# Patient Record
Sex: Female | Born: 1995 | Race: White | Hispanic: No | Smoking: Never smoker
Health system: Southern US, Community
[De-identification: ages and names within clinical notes are randomized; demographics above are authoritative.]

## PROBLEM LIST (undated history)

## (undated) DIAGNOSIS — U071 COVID-19: Secondary | ICD-10-CM

## (undated) HISTORY — PX: OTHER SURGICAL HISTORY: SHX169

---

## 2020-06-29 ENCOUNTER — Ambulatory Visit: Payer: BC Managed Care – PPO | Admitting: Podiatry

## 2020-06-29 ENCOUNTER — Encounter: Payer: Self-pay | Admitting: Podiatry

## 2020-06-29 ENCOUNTER — Other Ambulatory Visit: Payer: Self-pay

## 2020-06-29 DIAGNOSIS — M79675 Pain in left toe(s): Secondary | ICD-10-CM | POA: Diagnosis not present

## 2020-06-29 DIAGNOSIS — L603 Nail dystrophy: Secondary | ICD-10-CM

## 2020-06-29 NOTE — Patient Instructions (Signed)

## 2020-07-04 NOTE — Progress Notes (Signed)
Subjective:   Patient ID: Breanna Erickson, female   DOB: 24 y.o.   MRN: [id number]   HPI 24 year old female presents the office today for concerns of nail dystrophy, thickened, discolored toenails to her left big toe which is been ongoing since she was in middle school and she states the toenail is not growing.  The nail does cause tenderness at times.  Denies any redness or drainage or any swelling to the toenail sites at this time.   Review of Systems  All other systems reviewed and are negative.  History reviewed. No pertinent past medical history.  History reviewed. No pertinent surgical history.  No current outpatient medications on file.  No Known Allergies       Objective:  Physical Exam  General: AAO x3, NAD  Dermatological: Left hallux toenail is hypertrophic and dystrophic with yellow discoloration and does not appear to be growing.  There is no edema, erythema or signs of infection of the tendon site today.  Vascular: Dorsalis Pedis artery and Posterior Tibial artery pedal pulses are 2/4 bilateral with immedate capillary fill time. There is no pain with calf compression, swelling, warmth, erythema.   Neruologic: Grossly intact via light touch bilateral.   Musculoskeletal: No gross boney pedal deformities bilateral. No pain, crepitus, or limitation noted with foot and ankle range of motion bilateral. Muscular strength 5/5 in all groups tested bilateral.  Gait: Unassisted, Nonantalgic.       Assessment:   Left hallux onychodystrophy    Plan:  -Treatment options discussed including all alternatives, risks, and complications -Etiology of symptoms were discussed -After discussion regards to treatment options as the nail is not growing and due to the amount of dystrophy we discussed toenail removal.  She would like to have this done but given her work schedule we hold off on this today.  I will schedule her for after the holidays to have this done due to her schedule.   Monitoring signs or symptoms of infection.  Vivi Barrack DPM

## 2020-07-12 ENCOUNTER — Other Ambulatory Visit: Payer: Self-pay

## 2020-07-12 ENCOUNTER — Ambulatory Visit: Payer: BC Managed Care – PPO | Admitting: Podiatry

## 2020-07-12 DIAGNOSIS — M79675 Pain in left toe(s): Secondary | ICD-10-CM | POA: Diagnosis not present

## 2020-07-12 DIAGNOSIS — L603 Nail dystrophy: Secondary | ICD-10-CM | POA: Diagnosis not present

## 2020-07-12 NOTE — Patient Instructions (Addendum)

## 2020-07-14 ENCOUNTER — Telehealth: Payer: Self-pay | Admitting: Podiatry

## 2020-07-14 NOTE — Telephone Encounter (Signed)
Patient left message on the nurse line about her follow up care. She said she had questions but didn't state what they were.

## 2020-07-14 NOTE — Telephone Encounter (Signed)
I attempted to call the patient back- left voicemail

## 2020-07-15 ENCOUNTER — Telehealth: Payer: Self-pay | Admitting: Podiatry

## 2020-07-15 NOTE — Progress Notes (Signed)
Subjective: 24 year old female presents the office today to have her left big toenail removed given dystrophy to the nail nails no longer growing.  No occasion cause discomfort.  Denies any drainage or pus any swelling or redness. Denies any systemic complaints such as fevers, chills, nausea, vomiting. No acute changes since last appointment, and no other complaints at this time.   Objective: AAO x3, NAD DP/PT pulses palpable bilaterally, CRT less than 3 seconds Left hallux toenails hypertrophic, dystrophic with yellow-brown discoloration and significant dystrophy is present the nail.  There is no edema, erythema and signs of infection today.  No pain with calf compression, swelling, warmth, erythema  Assessment: Left hallux onychodystrophy  Plan: -All treatment options discussed with the patient including all alternatives, risks, complications.  -At this time, recommended total nail removal without chemical matricectomy to the left hallux toenail due to dystrophy. Risks and complications were discussed with the patient for which they understand and  verbally consent to the procedure. Under sterile conditions a total of 3 mL of a mixture of 2% lidocaine plain and 0.5% Marcaine plain was infiltrated in a hallux block fashion. Once anesthetized, the skin was prepped in sterile fashion. A tourniquet was then applied. Next the left hallux nail was excised making sure to remove the entire offending nail border. Once the nail was removed, the area was debrided and the underlying skin was intact. The area was irrigated and hemostasis was obtained.  A dry sterile dressing was applied. After application of the dressing the tourniquet was removed and there is found to be an immediate capillary refill time to the digit. The patient tolerated the procedure well any complications. Post procedure instructions were discussed the patient for which he verbally understood. Follow-up in one week for nail check or sooner  if any problems are to arise. Discussed signs/symptoms of worsening infection and directed to call the office immediately should any occur or go directly to the emergency room. In the meantime, encouraged to call the office with any questions, concerns, changes symptoms. -Nail sent for biopsy/culture to [facility] labs  Breanna Erickson DPM

## 2020-07-15 NOTE — Telephone Encounter (Signed)
I called and went went over the questions.  She has been doing well otherwise.

## 2020-07-15 NOTE — Telephone Encounter (Signed)
Patient wanted to know what she needed to do regarding soaking and when to let the toe breathe after the first week.

## 2020-07-27 ENCOUNTER — Ambulatory Visit: Payer: BC Managed Care – PPO | Admitting: Podiatry

## 2020-07-27 ENCOUNTER — Ambulatory Visit (INDEPENDENT_AMBULATORY_CARE_PROVIDER_SITE_OTHER): Payer: BC Managed Care – PPO | Admitting: Podiatry

## 2020-07-27 ENCOUNTER — Other Ambulatory Visit: Payer: Self-pay

## 2020-07-27 DIAGNOSIS — L600 Ingrowing nail: Secondary | ICD-10-CM

## 2020-07-27 NOTE — Patient Instructions (Signed)

## 2020-08-01 NOTE — Progress Notes (Signed)
Subjective: Selam Pietsch is a 25 y.o.  female returns to office today for follow up evaluation after having left hallux partial nail avulsion performed. Patient has been soaking using epsom salts and applying topical antibiotic covered with bandaid daily.  She states the toe has been doing better and not be any pain, swelling or redness or any drainage and she has no concerns.  Patient denies fevers, chills, nausea, vomiting. Denies any calf pain, chest pain, SOB.   Objective:  General: Well developed, nourished, in no acute distress, alert and oriented x3   Dermatology: Skin is warm, dry and supple bilateral. LEFT hallux nail border appears to be clean, dry, with mild scab. There is no surrounding erythema, edema, drainage/purulence. The remaining nails appear unremarkable at this time. There are no other lesions or other signs of infection present.  Neurovascular status: Intact. No lower extremity swelling; No pain with calf compression bilateral.  Musculoskeletal: No tenderness to palpation of the left hallux nail fold. Muscular strength within normal limits bilateral.   Assesement and Plan: S/p partial nail avulsion, doing well.   -Continue soaking in epsom salts twice a day followed by antibiotic ointment and a band-aid. Can leave uncovered at night. Continue this until completely healed.  -If the area has not healed in 2 weeks, call the office for follow-up appointment, or sooner if any problems arise.  -Monitor for any signs/symptoms of infection. Call the office immediately if any occur or go directly to the emergency room. Call with any questions/concerns. -Awaiting nail culture results  Ovid Curd, DPM

## 2020-09-10 ENCOUNTER — Encounter (HOSPITAL_COMMUNITY): Payer: Self-pay | Admitting: Emergency Medicine

## 2020-09-10 ENCOUNTER — Other Ambulatory Visit: Payer: Self-pay

## 2020-09-10 ENCOUNTER — Ambulatory Visit (HOSPITAL_COMMUNITY)
Admission: EM | Admit: 2020-09-10 | Discharge: 2020-09-10 | Disposition: A | Discharge status: Home / Self Care | Payer: BC Managed Care – PPO | Attending: Student | Admitting: Student

## 2020-09-10 DIAGNOSIS — J040 Acute laryngitis: Secondary | ICD-10-CM | POA: Diagnosis not present

## 2020-09-10 DIAGNOSIS — L089 Local infection of the skin and subcutaneous tissue, unspecified: Secondary | ICD-10-CM | POA: Diagnosis not present

## 2020-09-10 DIAGNOSIS — S01331A Puncture wound without foreign body of right ear, initial encounter: Secondary | ICD-10-CM | POA: Diagnosis not present

## 2020-09-10 DIAGNOSIS — J069 Acute upper respiratory infection, unspecified: Secondary | ICD-10-CM

## 2020-09-10 HISTORY — DX: COVID-19: U07.1

## 2020-09-10 MED ORDER — PREDNISONE 20 MG PO TABS: 20.0000 mg | ORAL_TABLET | Freq: Every day | ORAL | 0 refills | Status: AC

## 2020-09-10 MED ORDER — DOXYCYCLINE HYCLATE 100 MG PO CAPS: 100.0000 mg | ORAL_CAPSULE | Freq: Two times a day (BID) | ORAL | 0 refills | Status: AC

## 2020-09-10 NOTE — Discharge Instructions (Addendum)
-  For your laryngitis, start the steroid-prednisone, 1 pill daily for 5 days.  This can give you energy, so take this in the morning. -For your infected ear piercing, start the antibiotic-doxycycline (Vibramycin), 2 pills daily for 7 days.  Try to avoid taking this within 1 hour of eating.  If you are going to spend long periods of time in the sun, make sure to wear sunscreen while on this medication. -Also, try wearing gold or silver earrings instead of titanium.  Make sure to not wear earrings with medical or a combination of metals.

## 2020-09-10 NOTE — ED Triage Notes (Signed)
Pt presents today with c/o of scratching throat, nasal congestion and cough. Denies pain.

## 2020-09-10 NOTE — ED Provider Notes (Signed)
[facility]    CSN: [id number] Arrival date & time: 09/10/20  0900      History   Chief Complaint Chief Complaint  Patient presents with  . Nasal Congestion  . Sore Throat  . Cough    HPI Breanna Erickson is a 25 y.o. female presenting with nasal congestion, sore throat, cough, infected ear piercing.  History COVID-19 2 months ago. -States that she has been having URI symptoms for 7 days-nasal congestion, sore throat, cough, fevers.  The symptoms have all improved on their own, however she developed laryngitis 2 days ago and this is very annoying. Last fever was 6 days ago. She is wondering if there is a treatment that we can do to improve this.  States she is going to a concert next week, and wants to feel better for that. Denies current fevers/chills, n/v/d, shortness of breath, chest pain, cough, congestion, facial pain, teeth pain, headaches, sore throat, loss of taste/smell, swollen lymph nodes, ear pain.  -Also states that her ear piercing on the right side seems infected.  States she has had issues with this her entire life, and has been told multiple times that she just needs to clean the ear piercings better-but she has never been seen by a medical professional for this.  States that the back of her right ear has been swollen for 3 days, and a small amount of bloody discharge.  Has been been using titanium studs, and is wondering if this could be the problem.    HPI  Past Medical History:  Diagnosis Date  . COVID-19 06/2021    There are no problems to display for this patient.   Past Surgical History:  Procedure Laterality Date  . toe nail removed (left 1st)  06/2021    OB History   No obstetric history on file.      Home Medications    Prior to Admission medications   Medication Sig Start Date End Date Taking? Authorizing Provider  doxycycline (VIBRAMYCIN) 100 MG capsule Take 1 capsule (100 mg total) by mouth 2 (two) times daily for 7 days. 09/10/20  09/17/20 Yes Rhys Martini, PA-C  predniSONE (DELTASONE) 20 MG tablet Take 1 tablet (20 mg total) by mouth daily for 5 days. 09/10/20 09/15/20 Yes Rhys Martini, PA-C    Family History Family History  Problem Relation Age of Onset  . Stroke Mother   . Healthy Father     Social History Social History   Tobacco Use  . Smoking status: Never Smoker  . Smokeless tobacco: Never Used  Substance Use Topics  . Alcohol use: Not Currently    Comment: occ  . Drug use: Not Currently     Allergies   Patient has no known allergies.   Review of Systems Review of Systems  Constitutional: Negative for appetite change, chills and fever.  HENT: Positive for voice change. Negative for congestion, ear pain, rhinorrhea, sinus pressure, sinus pain, sore throat and trouble swallowing.   Eyes: Negative for redness and visual disturbance.  Respiratory: Negative for cough, chest tightness, shortness of breath and wheezing.   Cardiovascular: Negative for chest pain and palpitations.  Gastrointestinal: Negative for abdominal pain, constipation, diarrhea, nausea and vomiting.  Genitourinary: Negative for dysuria, frequency and urgency.  Musculoskeletal: Negative for myalgias.  Skin:       Infected ear piercing.  Neurological: Negative for dizziness, weakness and headaches.  Psychiatric/Behavioral: Negative for confusion.  All other systems reviewed and are negative.  Physical Exam Triage Vital Signs ED Triage Vitals  Enc Vitals Group     BP 09/10/20 0939 119/65     Pulse Rate 09/10/20 0939 87     Resp 09/10/20 0939 16     Temp 09/10/20 0939 98.4 F (36.9 C)     Temp Source 09/10/20 0939 Oral     SpO2 09/10/20 0939 100 %     Weight 09/10/20 0940 110 lb (49.9 kg)     Height 09/10/20 0940 5\' 7"  (1.702 m)     Head Circumference --      Peak Flow --      Pain Score 09/10/20 0940 0     Pain Loc --      Pain Edu? --      Excl. in GC? --    No data found.  Updated Vital Signs BP 119/65  (BP Location: Right Arm)   Pulse 87   Temp 98.4 F (36.9 C) (Oral)   Resp 16   Ht 5\' 7"  (1.702 m)   Wt 110 lb (49.9 kg)   LMP 08/30/2020 (Approximate)   SpO2 100%   BMI 17.23 kg/m   Visual Acuity Right Eye Distance:   Left Eye Distance:   Bilateral Distance:    Right Eye Near:   Left Eye Near:    Bilateral Near:     Physical Exam Vitals reviewed.  Constitutional:      General: She is not in acute distress.    Appearance: Normal appearance. She is not ill-appearing.  HENT:     Head: Normocephalic and atraumatic.     Comments: Smooth erythema posterior pharynx    Right Ear: Hearing, tympanic membrane, ear canal and external ear normal. No swelling or tenderness. There is no impacted cerumen. No mastoid tenderness. Tympanic membrane is not perforated, erythematous, retracted or bulging.     Left Ear: Hearing, tympanic membrane, ear canal and external ear normal. No swelling or tenderness. There is no impacted cerumen. No mastoid tenderness. Tympanic membrane is not perforated, erythematous, retracted or bulging.     Nose:     Right Sinus: No maxillary sinus tenderness or frontal sinus tenderness.     Left Sinus: No maxillary sinus tenderness or frontal sinus tenderness.     Mouth/Throat:     Mouth: Mucous membranes are moist.     Pharynx: Uvula midline. No oropharyngeal exudate or posterior oropharyngeal erythema.     Tonsils: No tonsillar exudate.  Cardiovascular:     Rate and Rhythm: Normal rate and regular rhythm.     Heart sounds: Normal heart sounds.  Pulmonary:     Breath sounds: Normal breath sounds and air entry. No wheezing, rhonchi or rales.  Chest:     Chest wall: No tenderness.  Abdominal:     General: Abdomen is flat. Bowel sounds are normal.     Tenderness: There is no abdominal tenderness. There is no guarding or rebound.  Lymphadenopathy:     Cervical: No cervical adenopathy.  Skin:    Comments: R posterior ear lobe- 53mm x 68mm area of swelling and  tenderness. No discharge able to be expressed. No erythema or warmth.   Neurological:     General: No focal deficit present.     Mental Status: She is alert and oriented to person, place, and time.  Psychiatric:        Attention and Perception: Attention and perception normal.        Mood and Affect: Mood and affect normal.  Behavior: Behavior normal. Behavior is cooperative.        Thought Content: Thought content normal.        Judgment: Judgment normal.      UC Treatments / Results  Labs (all labs ordered are listed, but only abnormal results are displayed) Labs Reviewed - No data to display  EKG   Radiology No results found.  Procedures Procedures (including critical care time)  Medications Ordered in UC Medications - No data to display  Initial Impression / Assessment and Plan / UC Course  I have reviewed the triage vital signs and the nursing notes.  Pertinent labs & imaging results that were available during my care of the patient were reviewed by me and considered in my medical decision making (see chart for details).     This patient is a 25 year old female presenting with laryngitis and infected ear piercing.  For infected ear piercing, doxycycline sent.  Wound care instructions provided.  Return precautions-worsening of infection despite treatment, worsening swelling, any fever/chills, discharge etc.  For laryngitis, prednisone as below.  She is not a diabetic.  Spent over 40 minutes obtaining H&P, performing physical, discussing treatment plan and plan for follow-up with patient. Patient agrees with plan.   This chart was dictated using voice recognition software, Dragon. Despite the best efforts of this provider to proofread and correct errors, errors may still occur which can change documentation meaning.    Final Clinical Impressions(s) / UC Diagnoses   Final diagnoses:  Laryngitis  Viral URI  Pierced ear infection, right, initial encounter      Discharge Instructions     -For your laryngitis, start the steroid-prednisone, 1 pill daily for 5 days.  This can give you energy, so take this in the morning. -For your infected ear piercing, start the antibiotic-doxycycline (Vibramycin), 2 pills daily for 7 days.  Try to avoid taking this within 1 hour of eating.  If you are going to spend long periods of time in the sun, make sure to wear sunscreen while on this medication. -Also, try wearing gold or silver earrings instead of titanium.  Make sure to not wear earrings with medical or a combination of metals.   ED Prescriptions    Medication Sig Dispense Auth. Provider   doxycycline (VIBRAMYCIN) 100 MG capsule Take 1 capsule (100 mg total) by mouth 2 (two) times daily for 7 days. 14 capsule Rhys Martini, PA-C   predniSONE (DELTASONE) 20 MG tablet Take 1 tablet (20 mg total) by mouth daily for 5 days. 5 tablet Rhys Martini, PA-C     PDMP not reviewed this encounter.   Rhys Martini, PA-C 09/10/20 1113

## 2021-07-19 ENCOUNTER — Other Ambulatory Visit: Payer: Self-pay

## 2021-07-19 ENCOUNTER — Ambulatory Visit (INDEPENDENT_AMBULATORY_CARE_PROVIDER_SITE_OTHER): Payer: BC Managed Care – PPO

## 2021-07-19 ENCOUNTER — Ambulatory Visit: Payer: BC Managed Care – PPO | Admitting: Podiatry

## 2021-07-19 DIAGNOSIS — L600 Ingrowing nail: Secondary | ICD-10-CM | POA: Diagnosis not present

## 2021-07-19 DIAGNOSIS — M79672 Pain in left foot: Secondary | ICD-10-CM

## 2021-07-19 DIAGNOSIS — M79675 Pain in left toe(s): Secondary | ICD-10-CM

## 2021-07-19 DIAGNOSIS — B351 Tinea unguium: Secondary | ICD-10-CM

## 2021-07-19 DIAGNOSIS — S9032XA Contusion of left foot, initial encounter: Secondary | ICD-10-CM

## 2021-07-19 MED ORDER — CEPHALEXIN 500 MG PO CAPS: 500.0000 mg | ORAL_CAPSULE | Freq: Three times a day (TID) | ORAL | 0 refills | Status: DC

## 2021-07-19 MED ORDER — EFINACONAZOLE 10 % EX SOLN: 1.0000 [drp] | Freq: Every day | CUTANEOUS | 11 refills | Status: DC

## 2021-07-19 NOTE — Patient Instructions (Signed)
Soak Instructions ° ° ° °THE DAY AFTER THE PROCEDURE ° °Place 1/4 cup of epsom salts in a quart of warm tap water.  Submerge your foot or feet with outer bandage intact for the initial soak; this will allow the bandage to become moist and wet for easy lift off.  Once you remove your bandage, continue to soak in the solution for 20 minutes.  This soak should be done twice a day.  Next, remove your foot or feet from solution, blot dry the affected area and cover.  You may use a band aid large enough to cover the area or use gauze and tape.  Apply other medications to the area as directed by the doctor such as polysporin neosporin. ° °IF YOUR SKIN BECOMES IRRITATED WHILE USING THESE INSTRUCTIONS, IT IS OKAY TO SWITCH TO  WHITE VINEGAR AND WATER. Or you may use antibacterial soap and water to keep the toe clean ° °Monitor for any signs/symptoms of infection. Call the office immediately if any occur or go directly to the emergency room. Call with any questions/concerns. ° ° °Efinaconazole Topical Solution °What is this medication? °EFINACONAZOLE (e FEE na KON a zole) is an antifungal medicine. It is used to treat certain kinds of fungal infections of the toenail. °This medicine may be used for other purposes; ask your health care provider or pharmacist if you have questions. °COMMON BRAND NAME(S): JUBLIA °What should I tell my care team before I take this medication? °They need to know if you have any of these conditions: °an unusual or allergic reaction to efinaconazole, other medicines, foods, dyes or preservatives °pregnant or trying to get pregnant °breast-feeding °How should I use this medication? °This medicine is for external use only. Do not take by mouth. Follow the directions on the label. Wash hands before and after use. Apply this medicine using the provided brush to cover the entire toenail. Do not use your medicine more often than directed. Finish the full course prescribed by your doctor or health care  professional even if you think your condition is better. Do not stop using except on the advice of your doctor or health care professional. °Talk to your pediatrician regarding the use of this medicine in children. While this drug may be prescribed for children as young as 6 years for selected conditions, precautions do apply. °Overdosage: If you think you have taken too much of this medicine contact a poison control center or emergency room at once. °NOTE: This medicine is only for you. Do not share this medicine with others. °What if I miss a dose? °If you miss a dose, use it as soon as you can. If it is almost time for your next dose, use only that dose. Do not use double or extra doses. °What may interact with this medication? °Interactions have not been studied. Do not use any other nail products (i.e., nail polish, pedicures) during treatment with this medicine. °This list may not describe all possible interactions. Give your health care provider a list of all the medicines, herbs, non-prescription drugs, or dietary supplements you use. Also tell them if you smoke, drink alcohol, or use illegal drugs. Some items may interact with your medicine. °What should I watch for while using this medication? °Do not get this medicine in your eyes. If you do, rinse out with plenty of cool tap water. °Tell your doctor or health care professional if your symptoms do not start to get better or if they get worse. °Wait for   at least 10 minutes after bathing before applying this medication. After bathing, make sure that your feet are very dry. Fungal infections like moist conditions. Do not walk around barefoot. To help prevent reinfection, wear freshly washed cotton, not synthetic clothing. Tell your doctor or health care professional if you develop sores or blisters that do not heal properly. If your nail infection returns after you stop using this medicine, contact your doctor or health care professional. °What side effects  may I notice from receiving this medication? °Side effects that you should report to your doctor or health care professional as soon as possible: °allergic reactions like skin rash, itching or hives, swelling of the face, lips, or tongue °ingrown toenail °Side effects that usually do not require medical attention (report to your doctor or health care professional if they continue or are bothersome): °mild skin irritation, burning, or itching °This list may not describe all possible side effects. Call your doctor for medical advice about side effects. You may report side effects to FDA at 1-800-FDA-1088. °Where should I keep my medication? °Keep out of the reach of children. °Store at room temperature between 20 and 25 degrees C (68 and 77 degrees F). Keep this medicine in the original container. Throw away any unused medicine after the expiration date. °This medicine is flammable. Avoid exposure to heat, fire, flame, and smoking. °NOTE: This sheet is a summary. It may not cover all possible information. If you have questions about this medicine, talk to your doctor, pharmacist, or health care provider. °© 2022 [employer] (2018-11-13 00:00:00) ° °

## 2021-07-22 NOTE — Progress Notes (Signed)
Subjective: 26 year old female presents the office today for concerns of discomfort to the left.  The nail.  Patient complains about starting to become uncomfortable and she is noticing redness and swelling with intermittent discomfort with pain levels 2/10.  States the nails becoming ingrown.  She has tried soaking Epson salts.  Denies any fevers or chills currently.  No other concerns.  Objective: AAO x3, NAD DP/PT pulses palpable bilaterally, CRT less than 3 seconds On the left hallux toenail the nail is dystrophic and discolored.  There is localized edema erythema but not able to notice any drainage or pus at this time.  Nail adhered to the underlying nailbed.  No open lesions. No pain with calf compression, swelling, warmth, erythema  Assessment: 26 year old female with ingrown toenail, onychomycosis  Plan: -All treatment options discussed with the patient including all alternatives, risks, complications.  -X-rays obtained reviewed.  No significant bone spur present which is contributing to the ingrown toenail.  No osteomyelitis or fracture noted. -We discussed nail removal but she is on hold off on that for now pending this when she has some time off.  For now discussed continuing Epson salt soaks and prescribed cephalexin.  Monitor for any signs or symptoms of recurrence of infection and let me know if there is any changes. -Prescribe Jublia for the likely nail fungus -Patient encouraged to call the office with any questions, concerns, change in symptoms.   Return for nail removal .  Trula Slade DPM

## 2021-08-09 ENCOUNTER — Other Ambulatory Visit: Payer: Self-pay

## 2021-08-09 ENCOUNTER — Ambulatory Visit: Payer: BC Managed Care – PPO | Admitting: Podiatry

## 2021-08-09 DIAGNOSIS — L608 Other nail disorders: Secondary | ICD-10-CM | POA: Diagnosis not present

## 2021-08-09 DIAGNOSIS — B351 Tinea unguium: Secondary | ICD-10-CM | POA: Diagnosis not present

## 2021-08-09 DIAGNOSIS — L600 Ingrowing nail: Secondary | ICD-10-CM | POA: Diagnosis not present

## 2021-08-09 NOTE — Patient Instructions (Signed)

## 2021-08-15 NOTE — Progress Notes (Signed)
Subjective: 26 year old female presents the office today for Evaluation of left ingrown toenail, onychodystrophy.  She presents today for nail removal.  She says the nail does still get tender with pressure.  She gets some occasional swelling redness of the toenail on the borders.  No drainage that she reports.  She has no other concerns today.  Objective: AAO x3, NAD DP/PT pulses palpable bilaterally, CRT less than 3 seconds Overall exam appears to be unchanged.  Left hallux toenails hypertrophic, dystrophic at the distal portion of nail.  Nails from adhered to the underlying nailbed.  There is tenderness palpation upon direct palpation of the nail.  No edema, erythema or signs of infection.  No pain with calf compression, swelling, warmth, erythema  Assessment: Symptomatic ingrown toenail, onychodystrophy  Plan: -All treatment options discussed with the patient including all alternatives, risks, complications.  -Discussed nail removal given the discomfort she wants to proceed with this. -At this time, discussed total nail removal without chemical matricectomy to the left hallux due to pain. Risks and complications were discussed with the patient for which they understand and  verbally consent to the procedure. Under sterile conditions a total of 3 mL of a mixture of 2% lidocaine plain and 0.5% Marcaine plain was infiltrated in a hallux block fashion. Once anesthetized, the skin was prepped in sterile fashion. A tourniquet was then applied. Next the left hallux nail was removed in total making sure to move all nail borders.  The distal portion of nail is significantly hypertrophic and dystrophic.  Once the nail was removed, the area was debrided and the underlying skin was intact. The area was irrigated and hemostasis was obtained.  A dry sterile dressing was applied. After application of the dressing the tourniquet was removed and there is found to be an immediate capillary refill time to the digit.  The patient tolerated the procedure well any complications. Post procedure instructions were discussed the patient for which he verbally understood. Follow-up in one week for nail check or sooner if any problems are to arise. Discussed signs/symptoms of worsening infection and directed to call the office immediately should any occur or go directly to the emergency room. In the meantime, encouraged to call the office with any questions, concerns, changes symptoms. -Patient encouraged to call the office with any questions, concerns, change in symptoms.   Vivi Barrack DPM

## 2021-08-23 ENCOUNTER — Other Ambulatory Visit: Payer: Self-pay

## 2021-08-23 ENCOUNTER — Ambulatory Visit: Payer: BC Managed Care – PPO | Admitting: Podiatry

## 2021-08-23 DIAGNOSIS — L600 Ingrowing nail: Secondary | ICD-10-CM

## 2021-08-23 DIAGNOSIS — B351 Tinea unguium: Secondary | ICD-10-CM

## 2021-08-23 NOTE — Patient Instructions (Signed)

## 2021-08-25 NOTE — Progress Notes (Signed)
Subjective: 26 year old female presents the office today for evaluation after undergoing total nail avulsion to her left big toe.  She states that she is doing well and she has not seen any drainage or pus and no significant pain.  She still soaking in Epsom salts.  Denies any fevers or chills.  No other concerns.  Objective: AAO x3, NAD DP/PT pulses palpable bilaterally, CRT less than 3 seconds Status post left total nail avulsion.  Smaller granulation tissue is present with scab is forming.  There is minimal edema but there is no ascending cellulitis.  No drainage or pus or any signs of infection there is no tenderness palpation. No pain with calf compression, swelling, warmth, erythema  Assessment: Status post left total nail avulsion, healing well  Plan: -All treatment options discussed with the patient including all alternatives, risks, complications.  -At this point recommend continue soaking with Epsom salts, the antibiotic ointment and a bandage in a day.  Discussed washing the toe with soap and water to help overtime get the scab off.  Monitor for any signs or symptoms of infection there is any issues to let me know.  We will need to monitor the nail as it grows back again.  Should it cause any issues going into let me know. -Patient encouraged to call the office with any questions, concerns, change in symptoms.   Trula Slade DPM

## 2021-09-15 ENCOUNTER — Encounter (HOSPITAL_COMMUNITY): Payer: Self-pay

## 2021-09-15 ENCOUNTER — Ambulatory Visit (HOSPITAL_COMMUNITY)
Admission: EM | Admit: 2021-09-15 | Discharge: 2021-09-15 | Disposition: A | Discharge status: Home / Self Care | Payer: BC Managed Care – PPO | Attending: Family Medicine | Admitting: Family Medicine

## 2021-09-15 ENCOUNTER — Other Ambulatory Visit: Payer: Self-pay

## 2021-09-15 DIAGNOSIS — R051 Acute cough: Secondary | ICD-10-CM

## 2021-09-15 DIAGNOSIS — J029 Acute pharyngitis, unspecified: Secondary | ICD-10-CM | POA: Diagnosis not present

## 2021-09-15 MED ORDER — PROMETHAZINE-DM 6.25-15 MG/5ML PO SYRP: 5.0000 mL | ORAL_SOLUTION | Freq: Four times a day (QID) | ORAL | 0 refills | Status: AC | PRN

## 2021-09-15 NOTE — ED Triage Notes (Signed)
Pt presents with c/o body aches, fever and chills x 2 days. States she has a sore throat x 1 week.  ?

## 2021-09-19 ENCOUNTER — Other Ambulatory Visit: Payer: Self-pay

## 2021-09-19 ENCOUNTER — Emergency Department (HOSPITAL_COMMUNITY): Payer: BC Managed Care – PPO

## 2021-09-19 ENCOUNTER — Inpatient Hospital Stay (HOSPITAL_COMMUNITY)
Admission: EM | Admit: 2021-09-19 | Discharge: 2021-09-22 | DRG: 872 | Disposition: A | Discharge status: Home / Self Care | Payer: BC Managed Care – PPO | Attending: Internal Medicine | Admitting: Internal Medicine

## 2021-09-19 DIAGNOSIS — Z8616 Personal history of COVID-19: Secondary | ICD-10-CM

## 2021-09-19 DIAGNOSIS — E871 Hypo-osmolality and hyponatremia: Secondary | ICD-10-CM | POA: Diagnosis present

## 2021-09-19 DIAGNOSIS — N10 Acute tubulo-interstitial nephritis: Secondary | ICD-10-CM | POA: Diagnosis present

## 2021-09-19 DIAGNOSIS — R0781 Pleurodynia: Secondary | ICD-10-CM | POA: Diagnosis present

## 2021-09-19 DIAGNOSIS — Z823 Family history of stroke: Secondary | ICD-10-CM

## 2021-09-19 DIAGNOSIS — I959 Hypotension, unspecified: Secondary | ICD-10-CM | POA: Diagnosis present

## 2021-09-19 DIAGNOSIS — R000 Tachycardia, unspecified: Secondary | ICD-10-CM | POA: Diagnosis present

## 2021-09-19 DIAGNOSIS — R079 Chest pain, unspecified: Secondary | ICD-10-CM

## 2021-09-19 DIAGNOSIS — B9620 Unspecified Escherichia coli [E. coli] as the cause of diseases classified elsewhere: Secondary | ICD-10-CM | POA: Diagnosis present

## 2021-09-19 DIAGNOSIS — Z79899 Other long term (current) drug therapy: Secondary | ICD-10-CM

## 2021-09-19 DIAGNOSIS — A419 Sepsis, unspecified organism: Principal | ICD-10-CM | POA: Diagnosis present

## 2021-09-19 DIAGNOSIS — Z20822 Contact with and (suspected) exposure to covid-19: Secondary | ICD-10-CM | POA: Diagnosis present

## 2021-09-19 DIAGNOSIS — R6520 Severe sepsis without septic shock: Secondary | ICD-10-CM | POA: Diagnosis present

## 2021-09-19 DIAGNOSIS — N12 Tubulo-interstitial nephritis, not specified as acute or chronic: Secondary | ICD-10-CM

## 2021-09-19 DIAGNOSIS — D649 Anemia, unspecified: Secondary | ICD-10-CM | POA: Diagnosis present

## 2021-09-19 DIAGNOSIS — Z793 Long term (current) use of hormonal contraceptives: Secondary | ICD-10-CM

## 2021-09-19 DIAGNOSIS — E876 Hypokalemia: Secondary | ICD-10-CM | POA: Diagnosis present

## 2021-09-19 LAB — CBC WITH DIFFERENTIAL/PLATELET
Abs Immature Granulocytes: 0.36 10*3/uL — ABNORMAL HIGH (ref 0.00–0.07)
Basophils Absolute: 0.1 10*3/uL (ref 0.0–0.1)
Basophils Relative: 0 %
Eosinophils Absolute: 0.1 10*3/uL (ref 0.0–0.5)
Eosinophils Relative: 0 %
HCT: 32.2 % — ABNORMAL LOW (ref 36.0–46.0)
Hemoglobin: 11.1 g/dL — ABNORMAL LOW (ref 12.0–15.0)
Immature Granulocytes: 1 %
Lymphocytes Relative: 4 %
Lymphs Abs: 1.2 10*3/uL (ref 0.7–4.0)
MCH: 31.3 pg (ref 26.0–34.0)
MCHC: 34.5 g/dL (ref 30.0–36.0)
MCV: 90.7 fL (ref 80.0–100.0)
Monocytes Absolute: 1.3 10*3/uL — ABNORMAL HIGH (ref 0.1–1.0)
Monocytes Relative: 4 %
Neutro Abs: 27.6 10*3/uL — ABNORMAL HIGH (ref 1.7–7.7)
Neutrophils Relative %: 91 %
Platelets: 305 10*3/uL (ref 150–400)
RBC: 3.55 MIL/uL — ABNORMAL LOW (ref 3.87–5.11)
RDW: 12.9 % (ref 11.5–15.5)
WBC: 30.6 10*3/uL — ABNORMAL HIGH (ref 4.0–10.5)
nRBC: 0 % (ref 0.0–0.2)

## 2021-09-19 LAB — RESP PANEL BY RT-PCR (FLU A&B, COVID) ARPGX2
Influenza A by PCR: NEGATIVE
Influenza B by PCR: NEGATIVE
SARS Coronavirus 2 by RT PCR: NEGATIVE

## 2021-09-19 LAB — URINALYSIS, ROUTINE W REFLEX MICROSCOPIC
Bilirubin Urine: NEGATIVE
Glucose, UA: NEGATIVE mg/dL
Ketones, ur: 5 mg/dL — AB
Nitrite: POSITIVE — AB
Protein, ur: 100 mg/dL — AB
Specific Gravity, Urine: 1.013 (ref 1.005–1.030)
WBC, UA: >50 WBC/hpf — ABNORMAL HIGH (ref 0–5)
pH: 5 (ref 5.0–8.0)

## 2021-09-19 LAB — COMPREHENSIVE METABOLIC PANEL
ALT: 9 U/L (ref 0–44)
AST: 15 U/L (ref 15–41)
Albumin: 3.8 g/dL (ref 3.5–5.0)
Alkaline Phosphatase: 40 U/L (ref 38–126)
Anion gap: 9 (ref 5–15)
BUN: 8 mg/dL (ref 6–20)
CO2: 22 mmol/L (ref 22–32)
Calcium: 8.8 mg/dL — ABNORMAL LOW (ref 8.9–10.3)
Chloride: 102 mmol/L (ref 98–111)
Creatinine, Ser: 0.91 mg/dL (ref 0.44–1.00)
GFR, Estimated: >60 mL/min (ref >60)
Glucose, Bld: 110 mg/dL — ABNORMAL HIGH (ref 70–99)
Potassium: 3.2 mmol/L — ABNORMAL LOW (ref 3.5–5.1)
Sodium: 133 mmol/L — ABNORMAL LOW (ref 135–145)
Total Bilirubin: 0.6 mg/dL (ref 0.3–1.2)
Total Protein: 7.7 g/dL (ref 6.5–8.1)

## 2021-09-19 LAB — I-STAT BETA HCG BLOOD, ED (MC, WL, AP ONLY): I-stat hCG, quantitative: <5 m[IU]/mL (ref <5)

## 2021-09-19 LAB — LIPASE, BLOOD: Lipase: 26 U/L (ref 11–51)

## 2021-09-19 MED ORDER — CEFTRIAXONE SODIUM 1 G IJ SOLR
1.0000 g | Freq: Once | INTRAMUSCULAR | Status: AC
  Administered 2021-09-19: 1 g via INTRAVENOUS
  Filled 2021-09-19: qty 10

## 2021-09-19 MED ORDER — IOHEXOL 300 MG/ML  SOLN
80.0000 mL | Freq: Once | INTRAMUSCULAR | Status: AC | PRN
  Administered 2021-09-19: 80 mL via INTRAVENOUS

## 2021-09-19 MED ORDER — SODIUM CHLORIDE 0.9 % IV BOLUS
1000.0000 mL | Freq: Once | INTRAVENOUS | Status: AC
  Administered 2021-09-19: 1000 mL via INTRAVENOUS

## 2021-09-19 MED ORDER — SODIUM CHLORIDE 0.9 % IV BOLUS
500.0000 mL | Freq: Once | INTRAVENOUS | Status: AC
  Administered 2021-09-20: 500 mL via INTRAVENOUS

## 2021-09-19 MED ORDER — LACTATED RINGERS IV SOLN
INTRAVENOUS | Status: AC
  Administered 2021-09-20: 1000 mL via INTRAVENOUS

## 2021-09-19 MED ORDER — ACETAMINOPHEN 500 MG PO TABS
1000.0000 mg | ORAL_TABLET | Freq: Once | ORAL | Status: AC
  Administered 2021-09-19: 1000 mg via ORAL
  Filled 2021-09-19: qty 2

## 2021-09-19 NOTE — ED Triage Notes (Signed)
Patient c/o flu like symptoms started this morning. Pt denies N/V/D. Pt a/xo4. ?

## 2021-09-19 NOTE — ED Provider Notes (Signed)
[facility]-EMERGENCY DEPT Provider Note   CSN: [id number] Arrival date & time: 09/19/21  1957     History  Chief Complaint  Patient presents with   flu like symptoms    Breanna Erickson is a 26 y.o. female.  The history is provided by the patient. No language interpreter was used.   26 year old female presenting with flulike symptoms.  Patient report about 2 weeks ago she developed cold symptoms including congestion and cough.  States she coughed quite a bit that she did develop some abdominal discomfort for the past few days.  Pain is to her upper abdomen, sharp and achy.  She now developed fever, headache, and body aches.  States the cough and congestion has mostly subsided.  She denies any dysuria.  She denies nausea vomiting diarrhea or rash.  Sore throat has resolved  Home Medications Prior to Admission medications   Medication Sig Start Date End Date Taking? Authorizing Provider  cephALEXin (KEFLEX) 500 MG capsule Take 1 capsule (500 mg total) by mouth 3 (three) times daily. 07/19/21   Vivi Barrack, DPM  Efinaconazole 10 % SOLN Apply 1 drop topically daily. 07/19/21   Vivi Barrack, DPM  promethazine-dextromethorphan (PROMETHAZINE-DM) 6.25-15 MG/5ML syrup Take 5 mLs by mouth 4 (four) times daily as needed for cough. 09/15/21   Mardella Layman, MD      Allergies    Patient has no known allergies.    Review of Systems   Review of Systems  All other systems reviewed and are negative.  Physical Exam Updated Vital Signs BP 120/71 (BP Location: Left Arm)    Pulse (!) 154    Temp (!) 102.9 F (39.4 C) (Oral)    Resp 18    Ht 5\' 7"  (1.702 m)    Wt 49 kg    SpO2 97%    BMI 16.92 kg/m  Physical Exam Vitals and nursing note reviewed.  Constitutional:      General: She is not in acute distress.    Appearance: She is well-developed.     Comments: Nontoxic in appearance  HENT:     Head: Atraumatic.     Nose: Nose normal.     Mouth/Throat:      Mouth: Mucous membranes are moist.     Comments: Uvula midline no tonsillar enlargement or exudate Eyes:     Conjunctiva/sclera: Conjunctivae normal.  Neck:     Comments: No nuchal rigidity Cardiovascular:     Rate and Rhythm: Tachycardia present.     Pulses: Normal pulses.     Heart sounds: Normal heart sounds.  Pulmonary:     Effort: Pulmonary effort is normal.     Breath sounds: No wheezing, rhonchi or rales.  Abdominal:     Palpations: Abdomen is soft.     Tenderness: There is abdominal tenderness (Epigastric and periumbilical tenderness no guarding or rebound tenderness.  Negative Murphy sign, no pain at McBurney's point).  Musculoskeletal:     Cervical back: Normal range of motion and neck supple. No rigidity.  Skin:    Findings: No rash.  Neurological:     Mental Status: She is alert.  Psychiatric:        Mood and Affect: Mood normal.    ED Results / Procedures / Treatments   Labs (all labs ordered are listed, but only abnormal results are displayed) Labs Reviewed  CBC WITH DIFFERENTIAL/PLATELET - Abnormal; Notable for the following components:      Result Value  WBC 30.6 (*)    RBC 3.55 (*)    Hemoglobin 11.1 (*)    HCT 32.2 (*)    Neutro Abs 27.6 (*)    Monocytes Absolute 1.3 (*)    Abs Immature Granulocytes 0.36 (*)    All other components within normal limits  COMPREHENSIVE METABOLIC PANEL - Abnormal; Notable for the following components:   Sodium 133 (*)    Potassium 3.2 (*)    Glucose, Bld 110 (*)    Calcium 8.8 (*)    All other components within normal limits  URINALYSIS, ROUTINE W REFLEX MICROSCOPIC - Abnormal; Notable for the following components:   APPearance CLOUDY (*)    Hgb urine dipstick MODERATE (*)    Ketones, ur 5 (*)    Protein, ur 100 (*)    Nitrite POSITIVE (*)    Leukocytes,Ua LARGE (*)    WBC, UA >50 (*)    Bacteria, UA MANY (*)    All other components within normal limits  RESP PANEL BY RT-PCR (FLU A&B, COVID) ARPGX2  CULTURE,  BLOOD (ROUTINE X 2)  CULTURE, BLOOD (ROUTINE X 2)  URINE CULTURE  LIPASE, BLOOD  LACTIC ACID, PLASMA  LACTIC ACID, PLASMA  PROTIME-INR  APTT  I-STAT BETA HCG BLOOD, ED ([facility], [facility], [facility] ONLY)    EKG None  Date: 09/19/2021  Rate: 154  Rhythm: sinus tachycardia  QRS Axis: normal  Intervals: normal  ST/T Wave abnormalities: normal  Conduction Disutrbances: none  Narrative Interpretation:   Old EKG Reviewed: No significant changes noted    Radiology CT ABDOMEN PELVIS W CONTRAST  Result Date: 09/19/2021 CLINICAL DATA:  Abdominal pain EXAM: CT ABDOMEN AND PELVIS WITH CONTRAST TECHNIQUE: Multidetector CT imaging of the abdomen and pelvis was performed using the standard protocol following bolus administration of intravenous contrast. RADIATION DOSE REDUCTION: This exam was performed according to the departmental dose-optimization program which includes automated exposure control, adjustment of the mA and/or kV according to patient size and/or use of iterative reconstruction technique. CONTRAST:  104mL OMNIPAQUE IOHEXOL 300 MG/ML  SOLN COMPARISON:  None. FINDINGS: Lower chest: No acute abnormality. Hepatobiliary: No focal liver abnormality is seen. No gallstones, gallbladder wall thickening, or biliary dilatation. Pancreas: Unremarkable. No pancreatic ductal dilatation or surrounding inflammatory changes. Spleen: Normal in size without focal abnormality. Adrenals/Urinary Tract: Adrenal glands are within normal limits. Kidneys show normal enhancement pattern with the exception of the lower pole of the right kidney which demonstrates patchy nephrogram suspicious for pyelonephritis. Small cyst is noted within the left kidney. No obstructive changes are seen. Mild enhancement of the ureteral wall on the right is noted. Bladder is decompressed. Stomach/Bowel: Fecal material is noted throughout the colon. No obstructive inflammatory changes are seen. The appendix is well visualized on the coronal images and  within normal limits. Small bowel and stomach are unremarkable. Vascular/Lymphatic: No significant vascular findings are present. No enlarged abdominal or pelvic lymph nodes. Reproductive: Uterus and bilateral adnexa are unremarkable. Other: No abdominal wall hernia or abnormality. No abdominopelvic ascites. Musculoskeletal: No acute or significant osseous findings. IMPRESSION: Decreased enhancement in the lower pole of the right kidney consistent with pyelonephritis. Some associated ureteral wall enhancement is noted consistent with underlying UTI. No other focal abnormality is noted. Electronically Signed   By: Alcide Clever M.D.   On: 09/19/2021 21:52   DG Chest Portable 1 View  Result Date: 09/19/2021 CLINICAL DATA:  Cough EXAM: PORTABLE CHEST 1 VIEW COMPARISON:  None. FINDINGS: Heart size and mediastinal contours are within  normal limits. No suspicious pulmonary opacities identified. No pleural effusion or pneumothorax visualized. No acute osseous abnormality appreciated. IMPRESSION: No acute intrathoracic process identified. Electronically Signed   By: Jannifer Hick M.D.   On: 09/19/2021 20:53    Procedures .Critical Care Performed by: Fayrene Helper, PA-C Authorized by: Fayrene Helper, PA-C   Critical care provider statement:    Critical care time (minutes):  40   Critical care was time spent personally by me on the following activities:  Development of treatment plan with patient or surrogate, discussions with consultants, evaluation of patient's response to treatment, examination of patient, ordering and review of laboratory studies, ordering and review of radiographic studies, ordering and performing treatments and interventions, pulse oximetry, re-evaluation of patient's condition and review of old charts    Medications Ordered in ED Medications  lactated ringers infusion (has no administration in time range)  sodium chloride 0.9 % bolus 500 mL (has no administration in time range)   acetaminophen (TYLENOL) tablet 1,000 mg (1,000 mg Oral Given 09/19/21 2031)  sodium chloride 0.9 % bolus 1,000 mL (0 mLs Intravenous Stopped 09/19/21 2134)  iohexol (OMNIPAQUE) 300 MG/ML solution 80 mL (80 mLs Intravenous Contrast Given 09/19/21 2134)  cefTRIAXone (ROCEPHIN) 1 g in sodium chloride 0.9 % 100 mL IVPB (0 g Intravenous Stopped 09/19/21 2337)  sodium chloride 0.9 % bolus 1,000 mL (0 mLs Intravenous Stopped 09/19/21 2337)    ED Course/ Medical Decision Making/ A&P                           Medical Decision Making Amount and/or Complexity of Data Reviewed Labs: ordered. Radiology: ordered.  Risk Prescription drug management.   BP 120/71 (BP Location: Left Arm)    Pulse (!) 154    Temp (!) 102.9 F (39.4 C) (Oral)    Resp 18    Ht 5\' 7"  (1.702 m)    Wt 49 kg    SpO2 97%    BMI 16.92 kg/m   8:37 PM Patient with cold symptoms for the past several weeks that has mostly resolved however she returns today complaining of having high fever headache body aches and having abdominal pain.  She has mild epigastric and periumbilical tenderness on initial exam.  Lungs are clear to auscultation.  Patient endorsed headache however she does not have any nuchal rigidity to suggest meningitis.  She does have a elevated temperature of 102.9, Tylenol given.  She is tachycardic with a heart rate of 154 likely sinus tachycardia secondary to her fever.  We will give IV fluid, check labs, and obtain viral respiratory panel.  Chest x-ray ordered.  Work-up initiated.  9:22 PM Labs obtained and independently reviewed interpreted by me.  Her pregnancy test is negative, she has a markedly elevated white count of 30.6.  She has mild hypokalemia with a potassium of 3.2.  She has normal lipase, viral respiratory panel obtained and negative for COVID and flu.  Initial chest x-ray without any signs of pneumonia or acute pulmonary finding.  Given this presentation and her abdominal pain, will obtain abdominal pelvis CT  scan for further assessment.  10:10 PM Urinalysis obtained which shows a cloudy appearance with nitrite positive, large amount of leukocyte esterase as well as many bacteria consistent with a urinary tract infection.  CT scan of the abdomen pelvis obtained demonstrate finding consistent with pyelonephritis with underlying UTI.  No evidence of appendicitis or colitis.  At this time patient was  giving Rocephin IV to treat for UTI.  On reassessment, heart rate did improve with IV fluid as well as fever has reduced.  We will continue with current management.  11:46 PM Unfortunately after receiving 2 L of IV fluid, blood pressure has decreased with current blood pressure of 98/60 and heart rate of 107.  In the setting of sepsis without improvement of vital sign, I discussed this with patient and will consult for admission.  Patient voiced understanding and agrees with plan.  Please note I did discussed option of perform pelvic exam to rule out STI but patient states she is currently not sexually active and is not a concern of STI therefore STI screening was not obtained  12:27 AM Pt sign out to oncoming team who will consult medicine for admission.   This patient presents to the ED for concern of abd pain, this involves an extensive number of treatment options, and is a complaint that carries with it a high risk of complications and morbidity.  The differential diagnosis includes appendicitis, colitis, diverticulitis, pancreatitis, cholecystitis, pneumonia, UTI, pyelonephritis, kidney stone, PUD, gastritis, viral GI  Co morbidities that complicate the patient evaluation none Additional history obtained:  Additional history obtained from no one External records from outside source obtained and reviewed including no prior notes  Lab Tests:  I Ordered, and personally interpreted labs.  The pertinent results include:  WBC 30, UA with UTI  Imaging Studies ordered:  I ordered imaging studies  including abd/pelvis Ct I independently visualized and interpreted imaging which showed pyelonephritis I agree with the radiologist interpretation  Cardiac Monitoring:  The patient was maintained on a cardiac monitor.  I personally viewed and interpreted the cardiac monitored which showed an underlying rhythm of: sinus tachycardia  Medicines ordered and prescription drug management:  I ordered medication including rocephin  for UTI Reevaluation of the patient after these medicines showed that the patient improved I have reviewed the patients home medicines and have made adjustments as needed  Test Considered: chest CTA to r/o PE however tachycardia is likely 2/2 sepsis and pt does not have SOB  Critical Interventions: IVF  IV abx  antipyretic  Problem List / ED Course: pyelonephritis  Reevaluation:  After the interventions noted above, I reevaluated the patient and found that they have :improved  Social Determinants of Health: lack of PCP  Dispostion:  After consideration of the diagnostic results and the patients response to treatment, I feel that the patent would benefit from outpt f/u.         Final Clinical Impression(s) / ED Diagnoses Final diagnoses:  Pyelonephritis  Sepsis, due to unspecified organism, unspecified whether acute organ dysfunction present [facility](HCC)    Rx / DC Orders ED Discharge Orders     None         Fayrene Helperran, Railyn House, PA-C 09/20/21 Marlowe Aschoff0028    Dixon, Ryan, MD 09/23/21 1144

## 2021-09-19 NOTE — ED Notes (Signed)
Pt NAD in bed, a/ox4. Pt states she began having RLQ ABD pain 4 days ago, then right flank pain last night and fever today. Pt denies cough, sick contacts, n/v/d, dysuria or GU symptoms.  ?

## 2021-09-20 ENCOUNTER — Other Ambulatory Visit: Payer: Self-pay

## 2021-09-20 ENCOUNTER — Encounter (HOSPITAL_COMMUNITY): Payer: Self-pay | Admitting: Internal Medicine

## 2021-09-20 DIAGNOSIS — R079 Chest pain, unspecified: Secondary | ICD-10-CM | POA: Diagnosis not present

## 2021-09-20 DIAGNOSIS — I959 Hypotension, unspecified: Secondary | ICD-10-CM | POA: Diagnosis present

## 2021-09-20 DIAGNOSIS — D649 Anemia, unspecified: Secondary | ICD-10-CM

## 2021-09-20 DIAGNOSIS — Z79899 Other long term (current) drug therapy: Secondary | ICD-10-CM | POA: Diagnosis not present

## 2021-09-20 DIAGNOSIS — Z823 Family history of stroke: Secondary | ICD-10-CM | POA: Diagnosis not present

## 2021-09-20 DIAGNOSIS — A419 Sepsis, unspecified organism: Secondary | ICD-10-CM | POA: Diagnosis present

## 2021-09-20 DIAGNOSIS — E871 Hypo-osmolality and hyponatremia: Secondary | ICD-10-CM

## 2021-09-20 DIAGNOSIS — E876 Hypokalemia: Secondary | ICD-10-CM | POA: Diagnosis present

## 2021-09-20 DIAGNOSIS — N10 Acute tubulo-interstitial nephritis: Secondary | ICD-10-CM | POA: Diagnosis present

## 2021-09-20 DIAGNOSIS — Z20822 Contact with and (suspected) exposure to covid-19: Secondary | ICD-10-CM | POA: Diagnosis present

## 2021-09-20 DIAGNOSIS — B9620 Unspecified Escherichia coli [E. coli] as the cause of diseases classified elsewhere: Secondary | ICD-10-CM | POA: Diagnosis present

## 2021-09-20 DIAGNOSIS — R000 Tachycardia, unspecified: Secondary | ICD-10-CM | POA: Diagnosis present

## 2021-09-20 DIAGNOSIS — R6520 Severe sepsis without septic shock: Secondary | ICD-10-CM

## 2021-09-20 DIAGNOSIS — R0781 Pleurodynia: Secondary | ICD-10-CM | POA: Diagnosis present

## 2021-09-20 DIAGNOSIS — Z793 Long term (current) use of hormonal contraceptives: Secondary | ICD-10-CM | POA: Diagnosis not present

## 2021-09-20 DIAGNOSIS — Z8616 Personal history of COVID-19: Secondary | ICD-10-CM | POA: Diagnosis not present

## 2021-09-20 LAB — BASIC METABOLIC PANEL
Anion gap: 8 (ref 5–15)
BUN: 6 mg/dL (ref 6–20)
CO2: 20 mmol/L — ABNORMAL LOW (ref 22–32)
Calcium: 7.5 mg/dL — ABNORMAL LOW (ref 8.9–10.3)
Chloride: 106 mmol/L (ref 98–111)
Creatinine, Ser: 0.7 mg/dL (ref 0.44–1.00)
GFR, Estimated: >60 mL/min (ref >60)
Glucose, Bld: 93 mg/dL (ref 70–99)
Potassium: 3.8 mmol/L (ref 3.5–5.1)
Sodium: 134 mmol/L — ABNORMAL LOW (ref 135–145)

## 2021-09-20 LAB — CBC
HCT: 27.4 % — ABNORMAL LOW (ref 36.0–46.0)
Hemoglobin: 9.2 g/dL — ABNORMAL LOW (ref 12.0–15.0)
MCH: 31 pg (ref 26.0–34.0)
MCHC: 33.6 g/dL (ref 30.0–36.0)
MCV: 92.3 fL (ref 80.0–100.0)
Platelets: 243 10*3/uL (ref 150–400)
RBC: 2.97 MIL/uL — ABNORMAL LOW (ref 3.87–5.11)
RDW: 13.2 % (ref 11.5–15.5)
WBC: 22.3 10*3/uL — ABNORMAL HIGH (ref 4.0–10.5)
nRBC: 0 % (ref 0.0–0.2)

## 2021-09-20 LAB — IRON AND TIBC
Iron: 11 ug/dL — ABNORMAL LOW (ref 28–170)
Saturation Ratios: 4 % — ABNORMAL LOW (ref 10.4–31.8)
TIBC: 254 ug/dL (ref 250–450)
UIBC: 243 ug/dL

## 2021-09-20 LAB — PROTIME-INR
INR: 1.4 — ABNORMAL HIGH (ref 0.8–1.2)
Prothrombin Time: 16.8 seconds — ABNORMAL HIGH (ref 11.4–15.2)

## 2021-09-20 LAB — D-DIMER, QUANTITATIVE: D-Dimer, Quant: 1.7 ug/mL-FEU — ABNORMAL HIGH (ref 0.00–0.50)

## 2021-09-20 LAB — TROPONIN I (HIGH SENSITIVITY): Troponin I (High Sensitivity): 10 ng/L (ref <18)

## 2021-09-20 LAB — LACTIC ACID, PLASMA
Lactic Acid, Venous: 1 mmol/L (ref 0.5–1.9)
Lactic Acid, Venous: 1.6 mmol/L (ref 0.5–1.9)

## 2021-09-20 LAB — MRSA NEXT GEN BY PCR, NASAL: MRSA by PCR Next Gen: NOT DETECTED

## 2021-09-20 LAB — APTT: aPTT: 38 seconds — ABNORMAL HIGH (ref 24–36)

## 2021-09-20 LAB — HIV ANTIBODY (ROUTINE TESTING W REFLEX): HIV Screen 4th Generation wRfx: NONREACTIVE

## 2021-09-20 LAB — FERRITIN: Ferritin: 51 ng/mL (ref 11–307)

## 2021-09-20 LAB — MAGNESIUM: Magnesium: 1.6 mg/dL — ABNORMAL LOW (ref 1.7–2.4)

## 2021-09-20 MED ORDER — POTASSIUM CHLORIDE 20 MEQ PO PACK
40.0000 meq | PACK | Freq: Once | ORAL | Status: AC
  Administered 2021-09-20: 40 meq via ORAL
  Filled 2021-09-20: qty 2

## 2021-09-20 MED ORDER — POTASSIUM CHLORIDE CRYS ER 20 MEQ PO TBCR
40.0000 meq | EXTENDED_RELEASE_TABLET | Freq: Once | ORAL | Status: AC
  Administered 2021-09-20: 40 meq via ORAL
  Filled 2021-09-20: qty 2

## 2021-09-20 MED ORDER — KETOROLAC TROMETHAMINE 15 MG/ML IJ SOLN
15.0000 mg | Freq: Four times a day (QID) | INTRAMUSCULAR | Status: DC | PRN
  Administered 2021-09-20 – 2021-09-21 (×3): 15 mg via INTRAVENOUS
  Filled 2021-09-20 (×3): qty 1

## 2021-09-20 MED ORDER — MAGNESIUM SULFATE 2 GM/50ML IV SOLN
2.0000 g | Freq: Once | INTRAVENOUS | Status: AC
  Administered 2021-09-20: 2 g via INTRAVENOUS
  Filled 2021-09-20: qty 50

## 2021-09-20 MED ORDER — ACETAMINOPHEN 325 MG PO TABS
650.0000 mg | ORAL_TABLET | Freq: Four times a day (QID) | ORAL | Status: DC | PRN
  Administered 2021-09-20: 650 mg via ORAL
  Filled 2021-09-20: qty 2

## 2021-09-20 MED ORDER — SODIUM CHLORIDE 0.9 % IV SOLN
2.0000 g | INTRAVENOUS | Status: DC
  Administered 2021-09-20 – 2021-09-21 (×2): 2 g via INTRAVENOUS
  Filled 2021-09-20 (×3): qty 20

## 2021-09-20 MED ORDER — CHLORHEXIDINE GLUCONATE CLOTH 2 % EX PADS: 6.0000 | MEDICATED_PAD | Freq: Every day | CUTANEOUS | Status: DC

## 2021-09-20 MED ORDER — KETOROLAC TROMETHAMINE 15 MG/ML IJ SOLN
15.0000 mg | Freq: Three times a day (TID) | INTRAMUSCULAR | Status: DC | PRN
  Administered 2021-09-20: 15 mg via INTRAVENOUS
  Filled 2021-09-20: qty 1

## 2021-09-20 MED ORDER — ONDANSETRON HCL 4 MG/2ML IJ SOLN
4.0000 mg | Freq: Four times a day (QID) | INTRAMUSCULAR | Status: DC | PRN
Start: 2021-09-20 — End: 2021-09-22
  Filled 2021-09-20: qty 2

## 2021-09-20 MED ORDER — SODIUM CHLORIDE 0.9 % IV SOLN: 1.0000 g | INTRAVENOUS | Status: DC

## 2021-09-20 MED ORDER — ACETAMINOPHEN 650 MG RE SUPP: 650.0000 mg | Freq: Four times a day (QID) | RECTAL | Status: DC | PRN

## 2021-09-20 MED ORDER — ENOXAPARIN SODIUM 40 MG/0.4ML IJ SOSY
40.0000 mg | PREFILLED_SYRINGE | INTRAMUSCULAR | Status: DC
  Administered 2021-09-20 – 2021-09-22 (×3): 40 mg via SUBCUTANEOUS
  Filled 2021-09-20 (×3): qty 0.4

## 2021-09-20 NOTE — Assessment & Plan Note (Addendum)
Improved

## 2021-09-20 NOTE — Assessment & Plan Note (Addendum)
Severe sepsis: Present on admission ?Leukocytosis ?Presented with fever, chills, tachycardia, hypotension, leukocytosis possibly from acute pyelonephritis ?-Treated with Rocephin. ?-Blood cultures cultures negative so far.  No temperature spikes over the last 24 hours. ?-Off IV fluids.  Hemodynamically stable and tolerating diet. ?-WBCs almost normal today. ?-Urine culture grew E. coli, pansensitive. ?-Sepsis has resolved. ?-Discharge patient home on oral Keflex for 7 more days. ?

## 2021-09-20 NOTE — Hospital Course (Addendum)
26 year old female with a past medical history of COVID infection in December 2022 presented with fever and right-sided abdominal/flank pain.  On presentation, she was febrile, tachycardic, hypotensive with WBC of 30.6 and a UA suggestive of UTI.  CT of the abdomen showed findings consistent with right kidney pyelonephritis.  She was started on IV fluids and antibiotics.  During the hospitalization, her condition has improved.  She is currently afebrile and leukocytosis has almost resolved.  Urine culture grew E. coli.  She will be discharged home today on oral Keflex for another week. ?

## 2021-09-20 NOTE — H&P (Signed)
History and Physical    Breanna Erickson FTD:[id number] DOB: 1995/10/20 DOA: 09/19/2021  DOS: the patient was seen and examined on 09/19/2021  PCP: Pcp, No   Patient coming from: Home  I have personally briefly reviewed patient's old medical records in [facility]  Patient is a 26 year old female with a past medical history of COVID infection in December 2022 presented to the ED complaining of fever and right-sided abdominal/flank pain.  In the ED, patient was febrile and tachycardic.  Hypotensive with systolic as low as 80s.  Labs showing WBC 30.6 with neutrophilic predominance.  Hemoglobin 11.1, MCV 90.7.  Sodium 133.  Potassium 3.2.  UA with positive nitrite, large amount of leukocytes, and microscopy showing greater than 50 WBCs and many bacteria.  Urine culture pending.  Lactic acid normal x2.  Blood cultures drawn.  CT showing findings consistent with right kidney pyelonephritis. Patient was given ceftriaxone, Tylenol, and 2.5 L normal saline boluses.  Patient states she is having right-sided abdominal and flank pain for the past few days.  Yesterday she started having fevers and chills.  She has felt nauseous but not vomited.  Denies dysuria, urinary frequency or urgency.  Reports shortness of breath and slight sharp central chest pain which is worse when taking deep breaths.  Denies leg pain or swelling.  Denies history of blood clots.  Reports history of URI 2 weeks ago.   Review of Systems:  Review of Systems  All other systems reviewed and are negative.  Past Medical History:  Diagnosis Date   COVID-19 06/2021    Past Surgical History:  Procedure Laterality Date   toe nail removed (left 1st)  06/2021     reports that she has never smoked. She has never used smokeless tobacco. She reports that she does not currently use alcohol. She reports that she does not currently use drugs.  No Known Allergies  Family History  Problem Relation Age of Onset   Stroke Mother     Healthy Father     Prior to Admission medications   Medication Sig Start Date End Date Taking? Authorizing Provider  AMNESTEEM 40 MG capsule Take 40 mg by mouth daily. 09/09/21  Yes [provider]  loratadine (CLARITIN) 10 MG tablet Take 10 mg by mouth daily.   Yes [provider]  norethindrone-ethinyl estradiol (LOESTRIN) 1-20 MG-MCG tablet Take 1 tablet by mouth daily. 07/08/21  Yes [provider]  promethazine-dextromethorphan (PROMETHAZINE-DM) 6.25-15 MG/5ML syrup Take 5 mLs by mouth 4 (four) times daily as needed for cough. 09/15/21  Yes Hagler, Arlys John, MD  Efinaconazole 10 % SOLN Apply 1 drop topically daily. Patient not taking: Reported on 09/20/2021 07/19/21   Vivi Barrack, DPM    Physical Exam: Vitals:   09/20/21 0200 09/20/21 0230 09/20/21 0400 09/20/21 0500  BP: (!) 100/59 101/61 95/63 101/61  Pulse: (!) 103 96 (!) 105 (!) 117  Resp: (!) 21 (!) 21 17 19   Temp:  98 F (36.7 C) 99.1 F (37.3 C)   TempSrc:  Oral Oral   SpO2: 97% 98% 100% 100%  Weight:  51.7 kg    Height:        Physical Exam Vitals and nursing note reviewed.  Constitutional:      General: She is not in acute distress. HENT:     Head: Normocephalic and atraumatic.  Eyes:     Extraocular Movements: Extraocular movements intact.     Conjunctiva/sclera: Conjunctivae normal.  Cardiovascular:  Rate and Rhythm: Regular rhythm. Tachycardia present.     Pulses: Normal pulses.  Pulmonary:     Effort: Pulmonary effort is normal. No respiratory distress.     Breath sounds: Normal breath sounds. No wheezing or rales.  Abdominal:     General: Bowel sounds are normal. There is no distension.     Palpations: Abdomen is soft.     Tenderness: There is no abdominal tenderness. There is no guarding or rebound.  Musculoskeletal:        General: No swelling or tenderness.     Cervical back: Normal range of motion.  Skin:    General: Skin is warm and dry.  Neurological:      General: No focal deficit present.     Mental Status: She is alert and oriented to person, place, and time.     Labs on Admission: I have personally reviewed following labs and imaging studies  CBC: Recent Labs  Lab 09/19/21 2051  WBC 30.6*  NEUTROABS 27.6*  HGB 11.1*  HCT 32.2*  MCV 90.7  PLT 305   Basic Metabolic Panel: Recent Labs  Lab 09/19/21 2051  NA 133*  K 3.2*  CL 102  CO2 22  GLUCOSE 110*  BUN 8  CREATININE 0.91  CALCIUM 8.8*   GFR: Estimated Creatinine Clearance: 78.5 mL/min (by C-G formula based on SCr of 0.91 mg/dL). Liver Function Tests: Recent Labs  Lab 09/19/21 2051  AST 15  ALT 9  ALKPHOS 40  BILITOT 0.6  PROT 7.7  ALBUMIN 3.8   Recent Labs  Lab 09/19/21 2051  LIPASE 26   No results for input(s): AMMONIA in the last 168 hours. Coagulation Profile: Recent Labs  Lab 09/20/21 0000  INR 1.4*   Cardiac Enzymes: No results for input(s): CKTOTAL, CKMB, CKMBINDEX, TROPONINI in the last 168 hours. BNP (last 3 results) No results for input(s): PROBNP in the last 8760 hours. HbA1C: No results for input(s): HGBA1C in the last 72 hours. CBG: No results for input(s): GLUCAP in the last 168 hours. Lipid Profile: No results for input(s): CHOL, HDL, LDLCALC, TRIG, CHOLHDL, LDLDIRECT in the last 72 hours. Thyroid Function Tests: No results for input(s): TSH, T4TOTAL, FREET4, T3FREE, THYROIDAB in the last 72 hours. Anemia Panel: No results for input(s): VITAMINB12, FOLATE, FERRITIN, TIBC, IRON, RETICCTPCT in the last 72 hours. Urine analysis:    Component Value Date/Time   COLORURINE YELLOW 09/19/2021 2049   APPEARANCEUR CLOUDY (A) 09/19/2021 2049   LABSPEC 1.013 09/19/2021 2049   PHURINE 5.0 09/19/2021 2049   GLUCOSEU NEGATIVE 09/19/2021 2049   HGBUR MODERATE (A) 09/19/2021 2049   BILIRUBINUR NEGATIVE 09/19/2021 2049   KETONESUR 5 (A) 09/19/2021 2049   PROTEINUR 100 (A) 09/19/2021 2049   NITRITE POSITIVE (A) 09/19/2021 2049    LEUKOCYTESUR LARGE (A) 09/19/2021 2049    Radiological Exams on Admission: I have personally reviewed images CT ABDOMEN PELVIS W CONTRAST  Result Date: 09/19/2021 CLINICAL DATA:  Abdominal pain EXAM: CT ABDOMEN AND PELVIS WITH CONTRAST TECHNIQUE: Multidetector CT imaging of the abdomen and pelvis was performed using the standard protocol following bolus administration of intravenous contrast. RADIATION DOSE REDUCTION: This exam was performed according to the departmental dose-optimization program which includes automated exposure control, adjustment of the mA and/or kV according to patient size and/or use of iterative reconstruction technique. CONTRAST:  82mL OMNIPAQUE IOHEXOL 300 MG/ML  SOLN COMPARISON:  None. FINDINGS: Lower chest: No acute abnormality. Hepatobiliary: No focal liver abnormality is seen. No gallstones, gallbladder wall  thickening, or biliary dilatation. Pancreas: Unremarkable. No pancreatic ductal dilatation or surrounding inflammatory changes. Spleen: Normal in size without focal abnormality. Adrenals/Urinary Tract: Adrenal glands are within normal limits. Kidneys show normal enhancement pattern with the exception of the lower pole of the right kidney which demonstrates patchy nephrogram suspicious for pyelonephritis. Small cyst is noted within the left kidney. No obstructive changes are seen. Mild enhancement of the ureteral wall on the right is noted. Bladder is decompressed. Stomach/Bowel: Fecal material is noted throughout the colon. No obstructive inflammatory changes are seen. The appendix is well visualized on the coronal images and within normal limits. Small bowel and stomach are unremarkable. Vascular/Lymphatic: No significant vascular findings are present. No enlarged abdominal or pelvic lymph nodes. Reproductive: Uterus and bilateral adnexa are unremarkable. Other: No abdominal wall hernia or abnormality. No abdominopelvic ascites. Musculoskeletal: No acute or significant osseous  findings. IMPRESSION: Decreased enhancement in the lower pole of the right kidney consistent with pyelonephritis. Some associated ureteral wall enhancement is noted consistent with underlying UTI. No other focal abnormality is noted. Electronically Signed   By: Alcide CleverMark  Lukens M.D.   On: 09/19/2021 21:52   DG Chest Portable 1 View  Result Date: 09/19/2021 CLINICAL DATA:  Cough EXAM: PORTABLE CHEST 1 VIEW COMPARISON:  None. FINDINGS: Heart size and mediastinal contours are within normal limits. No suspicious pulmonary opacities identified. No pleural effusion or pneumothorax visualized. No acute osseous abnormality appreciated. IMPRESSION: No acute intrathoracic process identified. Electronically Signed   By: Jannifer Hickelaney  Williams M.D.   On: 09/19/2021 20:53    EKG: I have personally reviewed EKG: Sinus tachycardia, no prior tracing for comparison.  Assessment/Plan Principal Problem:   Acute pyelonephritis Active Problems:   Chest pain   Normocytic anemia   Hyponatremia   Hypokalemia    Assessment and Plan: * Acute pyelonephritis Severe sepsis Patient presenting with complaints of right-sided abdominal/flank pain, fevers, and chills.  Meets criteria for severe sepsis with fever, tachycardia, significant leukocytosis, and hypotension.  Lactic acid normal x2. UA with positive nitrite, large amount of leukocytes, and microscopy showing greater than 50 WBCs and many bacteria. CT showing findings consistent with right kidney pyelonephritis. -Continue ceftriaxone.  Hypotension has resolved after 2.5 L fluid boluses, however, continues to be tachycardic with heart rate in the 120s.  Continue IV fluid hydration.  Toradol as needed for pain, Tylenol as needed.  Zofran as needed for nausea.  Urine and blood cultures pending.  Monitor WBC count.  Chest pain Patient is endorsing dyspnea and slight pleuritic chest pain.  Tachycardic in the setting of infection/sepsis.  She is on oral contraceptives which can  increase the risk of PE, however, has no clinical signs of DVT and not hypoxic. -Check D-dimer level, if elevated, CTA to rule out PE.  ACS less likely given age and no major risk factors, check troponin level.  Hypokalemia Mild. -Replace potassium.  Check magnesium level and replace if low.  Continue to monitor electrolytes.  Hyponatremia Mild. -IV fluid hydration, continue to monitor  Normocytic anemia Mild.  Patient denies any symptoms of GI bleed or heavy menses. -Check iron, ferritin, and TIBC.   DVT prophylaxis: Lovenox Code Status: Full Code Family Communication: No family at bedside.  Diagnostic findings and treatment plan discussed with the patient. Admission status: Inpatient, Step Down Unit It is my clinical opinion that admission to INPATIENT is reasonable and necessary because of the expectation that this patient will require hospital care that crosses at least 2 midnights to treat  this condition based on the medical complexity of the problems presented.  Given the aforementioned information, the predictability of an adverse outcome is felt to be significant.   John Giovanni, MD [facility] 09/20/2021, 5:57 AM

## 2021-09-20 NOTE — Assessment & Plan Note (Addendum)
Resolved

## 2021-09-20 NOTE — ED Provider Notes (Signed)
Care of patient assumed from PA [place] at 1223.  Agree with history, physical exam and plan.  See their note for further details. Briefly, 26 year old female presenting with flulike symptoms.  Patient has had sharp and achy upper abdominal pain over the last few days. ? ?Patient's work-up provided by PA Laveda Norman revealed sepsis secondary to pyelonephritis.  Patient was started on ceftriaxone and received sodium chloride and lactated Ringer fluid boluses.  At time of handoff consultation with hospitalist for admission is pending. ? ?I spoke with Dr. Loney Loh who will see the patient for admission. ?  ?Haskel Schroeder, PA-C ?09/20/21 3007 ? ?  ?Nira Conn, MD ?09/20/21 0730 ? ?

## 2021-09-20 NOTE — Assessment & Plan Note (Addendum)
-   Troponin flat.  D-dimer slightly elevated but patient denies any current chest pain. ?-Possibly from sepsis/pyelonephritis.  No further work-up needed. ?

## 2021-09-20 NOTE — Subjective & Objective (Addendum)
Patient is a 26 year old female with a past medical history of COVID infection in December 2022 presented to the ED complaining of fever and right-sided abdominal/flank pain.  In the ED, patient was febrile and tachycardic.  Hypotensive with systolic as low as 80s.  Labs showing WBC 30.6 with neutrophilic predominance.  Hemoglobin 11.1, MCV 90.7.  Sodium 133.  Potassium 3.2.  UA with positive nitrite, large amount of leukocytes, and microscopy showing greater than 50 WBCs and many bacteria.  Urine culture pending.  Lactic acid normal x2.  Blood cultures drawn.  CT showing findings consistent with right kidney pyelonephritis. Patient was given ceftriaxone, Tylenol, and 2.5 L normal saline boluses. ? ?Patient states she is having right-sided abdominal and flank pain for the past few days.  Yesterday she started having fevers and chills.  She has felt nauseous but not vomited.  Denies dysuria, urinary frequency or urgency.  Reports shortness of breath and slight sharp central chest pain which is worse when taking deep breaths.  Denies leg pain or swelling.  Denies history of blood clots.  Reports history of URI 2 weeks ago. ?

## 2021-09-20 NOTE — Progress Notes (Signed)
?  Transition of Care (TOC) Screening Note ? ? ?Patient Details  ?Name: Breanna Erickson ?Date of Birth: 08/13/1995 ? ? ?Transition of Care (TOC) CM/SW Contact:    ?Reghan Thul, LCSW ?Phone Number: ?09/20/2021, 3:46 PM ? ? ? ?Transition of Care Department [facility]) has reviewed patient and no TOC needs have been identified at this time. We will continue to monitor patient advancement through interdisciplinary progression rounds. If new patient transition needs arise, please place a TOC consult. ? ? ?

## 2021-09-20 NOTE — ED Notes (Signed)
Pt up to restroom, steady gait

## 2021-09-20 NOTE — Assessment & Plan Note (Addendum)
-   Hemoglobin stable.  Outpatient follow-up. ?

## 2021-09-20 NOTE — Progress Notes (Signed)
?  Progress Note ? ? ?Patient: Breanna Erickson KYH:[id number] DOB: 09-25-1995 DOA: 09/19/2021     0 ?DOS: the patient was seen and examined on 09/20/2021 ?  ?Brief hospital course: ? 26 year old female with a past medical history of COVID infection in December 2022 presented with fever and right-sided abdominal/flank pain.  On presentation, she was febrile, tachycardic, hypotensive with WBC of 30.6 and a UA suggestive of UTI.  CT of the abdomen showed findings consistent with right kidney pyelonephritis.  She was started on IV fluids and antibiotics. ? ?Assessment and Plan: ?* Acute pyelonephritis ?Severe sepsis: Present on admission ?Leukocytosis ?Presented with fever, chills, tachycardia, hypotension, leukocytosis possibly from acute pyelonephritis ?-Continue ceftriaxone.   ?-Follow cultures. ?-Continue IV fluids. ?-WBCs improving.  Monitor. ? ?Chest pain ?- Troponin flat.  D-dimer slightly elevated but patient denies any current chest pain. ?-Possibly from sepsis/pyelonephritis.  No further work-up needed. ? ?Hypomagnesemia ?- Replace.  Repeat a.m. labs. ? ?Hypokalemia ?- Resolved. ? ?Hyponatremia ?- Mild.  Sodium 134 today.  Continue IV fluids. ? ?Normocytic anemia ?- Hemoglobin stable.  Outpatient follow-up. ? ? ? ? ?  ? ?Subjective:  ?Patient seen and examined at bedside.  Feels slightly better.  Still has some flank pain with some nausea.  No chest pain, current fever or vomiting reported. ? ?Physical Exam: ?Vitals:  ? 09/20/21 0400 09/20/21 0500 09/20/21 0600 09/20/21 0633  ?BP: 95/63 101/61 109/67   ?Pulse: (!) 105 (!) 117 (!) 128   ?Resp: 17 19 (!) 28   ?Temp: 99.1 ?F (37.3 ?C)   99.4 ?F (37.4 ?C)  ?TempSrc: Oral   Axillary  ?SpO2: 100% 100% 96%   ?Weight:      ?Height:      ? ?General: No acute distress, currently on room air ?ENT/neck: No elevated JVD.  No obvious masses  ?respiratory: Bilateral decreased breath sounds at bases with some scattered crackles ?CVS: S1-S2 heard, rate controlled ?Abdominal:  Soft, nontender, nondistended, no organomegaly, bowel sounds heard ?Extremities: No cyanosis, clubbing, edema ?Genitourinary: Mild right flank tenderness present. ?CNS: Alert, awake and oriented.  No focal neurologic deficit.  Moving extremities. ?Lymph: No cervical lymphadenopathy ?Skin: No rashes, lesions, ulcers ?Psych: No signs of agitation.  Affect is mostly flat.  Musculoskeletal: No obvious joint deformity/tenderness/swelling ? ?Data Reviewed: ?I have reviewed patient's investigation during this hospitalization including imaging and blood work myself.  Today, sodium 134, potassium 3.8, magnesium 1.6, troponin 10, WBC 22.3, hemoglobin 9.2, platelets 243.  Cultures have remained negative so far. ? ? ?Family Communication: None at bedside ? ?Disposition: ?Status is: Inpatient ?Remains inpatient appropriate because: Of severity of illness ? Planned Discharge Destination: Home in 1 to 3 days pending clinical improvement ? ? ? ?Author: ?Glade Lloyd, MD ?09/20/2021 11:04 AM ? ?For on call review [email or web address].  ?

## 2021-09-20 NOTE — Sepsis Progress Note (Signed)
Antibiotics given before code sepsis ordered & blood cultures collected. ?

## 2021-09-20 NOTE — Sepsis Progress Note (Signed)
Monitoring for the code sepsis protocol. °

## 2021-09-20 NOTE — ED Provider Notes (Signed)
?  [facility] ? ? ?[id number] ?09/15/21 Arrival Time: 1803 ? ?ASSESSMENT & PLAN: ? ?1. Acute cough   ?2. Sore throat   ? ?Discussed typical duration of viral illnesses. ?OTC symptom care as needed. ? ?Discharge Medication List as of 09/15/2021  7:14 PM  ?  ? ?START taking these medications  ? Details  ?promethazine-dextromethorphan (PROMETHAZINE-DM) 6.25-15 MG/5ML syrup Take 5 mLs by mouth 4 (four) times daily as needed for cough., Starting Thu 09/15/2021, Normal  ?  ?  ? ? ? Follow-up Information   ? ? [facility].   ?Specialty: Urgent Care ?Why: If worsening or failing to improve as anticipated. ?Contact information: ?[address] ?[place] [address] ?[phone] ? ?  ?  ? ?  ?  ? ?  ? ? ?Reviewed expectations re: course of current medical issues. Questions answered. ?Outlined signs and symptoms indicating need for more acute intervention. ?Understanding verbalized. ?After Visit Summary given. ? ? ?SUBJECTIVE: ?History from: Patient. ?Breanna Erickson is a 26 y.o. female. Reports: body aches, subj fever, chills with assoc cough and ST; ST present over past week. Other symptoms over the past 1-2 days. Normal bowel/bladder habits. Denies: difficulty breathing. Normal PO intake without n/v/d. ? ?OBJECTIVE: ? ?Vitals:  ? 09/15/21 1852  ?BP: 114/65  ?Pulse: 97  ?Resp: 18  ?Temp: 99.4 ?F (37.4 ?C)  ?TempSrc: Oral  ?SpO2: 100%  ?  ?General appearance: alert; no distress ?Eyes: PERRLA; EOMI; conjunctiva normal ?HENT: [place]; AT; with nasal congestion ?Neck: supple  ?Lungs: speaks full sentences without difficulty; unlabored ?Extremities: no edema ?Skin: warm and dry ?Neurologic: normal gait ?Psychological: alert and cooperative; normal mood and affect ? ? ? ?No Known Allergies ? ?Past Medical History:  ?Diagnosis Date  ? COVID-19 06/2021  ? ?Social History  ? ?Socioeconomic History  ? Marital status: Significant Other  ?  Spouse name: Not on file  ? Number of children:  Not on file  ? Years of education: Not on file  ? Highest education level: Not on file  ?Occupational History  ? Not on file  ?Tobacco Use  ? Smoking status: Never  ? Smokeless tobacco: Never  ?Substance and Sexual Activity  ? Alcohol use: Not Currently  ?  Comment: occ  ? Drug use: Not Currently  ? Sexual activity: Yes  ?  Birth control/protection: None  ?Other Topics Concern  ? Not on file  ?Social History Narrative  ? Not on file  ? ?Social Determinants of Health  ? ?Financial Resource Strain: Not on file  ?Food Insecurity: Not on file  ?Transportation Needs: Not on file  ?Physical Activity: Not on file  ?Stress: Not on file  ?Social Connections: Not on file  ?Intimate Partner Violence: Not on file  ? ?Family History  ?Problem Relation Age of Onset  ? Stroke Mother   ? Healthy Father   ? ?Past Surgical History:  ?Procedure Laterality Date  ? toe nail removed (left 1st)  06/2021  ? ?  ?Mardella Layman, MD ?09/20/21 0940 ? ?

## 2021-09-21 DIAGNOSIS — N10 Acute tubulo-interstitial nephritis: Secondary | ICD-10-CM | POA: Diagnosis not present

## 2021-09-21 DIAGNOSIS — E876 Hypokalemia: Secondary | ICD-10-CM | POA: Diagnosis not present

## 2021-09-21 DIAGNOSIS — R079 Chest pain, unspecified: Secondary | ICD-10-CM | POA: Diagnosis not present

## 2021-09-21 LAB — CBC WITH DIFFERENTIAL/PLATELET
Abs Immature Granulocytes: 0.46 10*3/uL — ABNORMAL HIGH (ref 0.00–0.07)
Basophils Absolute: 0 10*3/uL (ref 0.0–0.1)
Basophils Relative: 0 %
Eosinophils Absolute: 0 10*3/uL (ref 0.0–0.5)
Eosinophils Relative: 0 %
HCT: 27.6 % — ABNORMAL LOW (ref 36.0–46.0)
Hemoglobin: 8.9 g/dL — ABNORMAL LOW (ref 12.0–15.0)
Immature Granulocytes: 2 %
Lymphocytes Relative: 7 %
Lymphs Abs: 1.4 10*3/uL (ref 0.7–4.0)
MCH: 30.7 pg (ref 26.0–34.0)
MCHC: 32.2 g/dL (ref 30.0–36.0)
MCV: 95.2 fL (ref 80.0–100.0)
Monocytes Absolute: 1.2 10*3/uL — ABNORMAL HIGH (ref 0.1–1.0)
Monocytes Relative: 6 %
Neutro Abs: 17.8 10*3/uL — ABNORMAL HIGH (ref 1.7–7.7)
Neutrophils Relative %: 85 %
Platelets: 253 10*3/uL (ref 150–400)
RBC: 2.9 MIL/uL — ABNORMAL LOW (ref 3.87–5.11)
RDW: 13.6 % (ref 11.5–15.5)
WBC: 20.8 10*3/uL — ABNORMAL HIGH (ref 4.0–10.5)
nRBC: 0 % (ref 0.0–0.2)

## 2021-09-21 LAB — BASIC METABOLIC PANEL
Anion gap: 5 (ref 5–15)
BUN: 6 mg/dL (ref 6–20)
CO2: 22 mmol/L (ref 22–32)
Calcium: 8.1 mg/dL — ABNORMAL LOW (ref 8.9–10.3)
Chloride: 110 mmol/L (ref 98–111)
Creatinine, Ser: 0.76 mg/dL (ref 0.44–1.00)
GFR, Estimated: >60 mL/min (ref >60)
Glucose, Bld: 96 mg/dL (ref 70–99)
Potassium: 4.1 mmol/L (ref 3.5–5.1)
Sodium: 137 mmol/L (ref 135–145)

## 2021-09-21 LAB — MAGNESIUM: Magnesium: 2.5 mg/dL — ABNORMAL HIGH (ref 1.7–2.4)

## 2021-09-21 NOTE — Progress Notes (Signed)
?  Progress Note ? ? ?Patient: Breanna Erickson [id number] DOB: October 18, 1995 DOA: 09/19/2021     1 ?DOS: the patient was seen and examined on 09/21/2021 ?  ?Brief hospital course: ? 26 year old female with a past medical history of COVID infection in December 2022 presented with fever and right-sided abdominal/flank pain.  On presentation, she was febrile, tachycardic, hypotensive with WBC of 30.6 and a UA suggestive of UTI.  CT of the abdomen showed findings consistent with right kidney pyelonephritis.  She was started on IV fluids and antibiotics. ? ?Assessment and Plan: ?* Acute pyelonephritis ?Severe sepsis: Present on admission ?Leukocytosis ?Presented with fever, chills, tachycardia, hypotension, leukocytosis possibly from acute pyelonephritis ?-Continue ceftriaxone.   ?-Cultures negative so far.  Still spiking temperatures.  Tmax of 102.9 over the last 24 hours. ?-Off IV fluids.  Encourage oral intake. ?-WBCs improving but still significant.  Monitor. ? ?Chest pain ?- Troponin flat.  D-dimer slightly elevated but patient denies any current chest pain. ?-Possibly from sepsis/pyelonephritis.  No further work-up needed. ? ?Hypomagnesemia ?- Improved ? ?Hypokalemia ?- Resolved. ? ?Hyponatremia ?- Improved ? ?Normocytic anemia ?- Hemoglobin stable.  Outpatient follow-up. ? ? ? ? ?  ? ?Subjective:  ?Patient seen and examined at bedside.  Still having intermittent fevers.  Feels slightly better.  Still having intermittent right flank pain.  Complains of headache. No vomiting or chest pain reported. ? ?Physical Exam: ?Vitals:  ? 09/20/21 1836 09/20/21 2154 09/21/21 0202 09/21/21 0352  ?BP: (!) 133/116 (!) 92/52 (!) 106/55 105/66  ?Pulse: 97 98 (!) 110 (!) 110  ?Resp:  18 18 18   ?Temp: 98.3 ?F (36.8 ?C) 99.6 ?F (37.6 ?C) 100.3 ?F (37.9 ?C) 99.2 ?F (37.3 ?C)  ?TempSrc: Oral Oral Oral Oral  ?SpO2: 100% 100% 98% 99%  ?Weight:      ?Height:      ? ?General: On room air.  No distress ?ENT/neck: No thyromegaly.  JVD is not  elevated  ?respiratory: Decreased breath sounds at bases bilaterally with some crackles; no wheezing CVS: S1-S2 heard, tachycardic  ?abdominal: Soft, nontender, slightly distended; no organomegaly, normal bowel sounds are heard ?Genitourinary: Right flank is slightly tender ?Extremities: No cyanosis, edema or clubbing.   ?CNS: Awake and alert.  No focal neurologic deficit.  Moves extremities ?Lymph: No obvious lymphadenopathy ?Skin: No obvious ecchymosis/lesions  ?psych: Flat affect.  Currently not agitated  ?musculoskeletal: No obvious joint swelling/deformity ? ?Data Reviewed: ?I have reviewed patient's investigation during this hospitalization including imaging and blood work myself.  Today, sodium 137, potassium 4.1, magnesium 2.5, WBC 20.8, hemoglobin 8.9.  Blood cultures and urine cultures are negative so far. ? ?Family Communication: None at bedside ?  ?Disposition: ?Status is: Inpatient ?Remains inpatient appropriate because: Of severity of illness ? Planned Discharge Destination: Home in 1 to 2 days pending clinical improvement ?  ? ? ? ? ? ?Author: ?Aline August, MD ?09/21/2021 8:30 AM ? ?For on call review [email or web address].  ?

## 2021-09-21 NOTE — Plan of Care (Signed)

## 2021-09-22 DIAGNOSIS — D649 Anemia, unspecified: Secondary | ICD-10-CM | POA: Diagnosis not present

## 2021-09-22 DIAGNOSIS — E871 Hypo-osmolality and hyponatremia: Secondary | ICD-10-CM | POA: Diagnosis not present

## 2021-09-22 DIAGNOSIS — N10 Acute tubulo-interstitial nephritis: Secondary | ICD-10-CM | POA: Diagnosis not present

## 2021-09-22 LAB — URINE CULTURE: Culture: >=100000 — AB

## 2021-09-22 LAB — BASIC METABOLIC PANEL
Anion gap: 6 (ref 5–15)
BUN: 6 mg/dL (ref 6–20)
CO2: 20 mmol/L — ABNORMAL LOW (ref 22–32)
Calcium: 8 mg/dL — ABNORMAL LOW (ref 8.9–10.3)
Chloride: 109 mmol/L (ref 98–111)
Creatinine, Ser: 0.52 mg/dL (ref 0.44–1.00)
GFR, Estimated: >60 mL/min (ref >60)
Glucose, Bld: 92 mg/dL (ref 70–99)
Potassium: 4 mmol/L (ref 3.5–5.1)
Sodium: 135 mmol/L (ref 135–145)

## 2021-09-22 LAB — CBC WITH DIFFERENTIAL/PLATELET
Abs Immature Granulocytes: 0.07 10*3/uL (ref 0.00–0.07)
Basophils Absolute: 0 10*3/uL (ref 0.0–0.1)
Basophils Relative: 0 %
Eosinophils Absolute: 0.1 10*3/uL (ref 0.0–0.5)
Eosinophils Relative: 0 %
HCT: 27.9 % — ABNORMAL LOW (ref 36.0–46.0)
Hemoglobin: 9.3 g/dL — ABNORMAL LOW (ref 12.0–15.0)
Immature Granulocytes: 1 %
Lymphocytes Relative: 12 %
Lymphs Abs: 1.4 10*3/uL (ref 0.7–4.0)
MCH: 31 pg (ref 26.0–34.0)
MCHC: 33.3 g/dL (ref 30.0–36.0)
MCV: 93 fL (ref 80.0–100.0)
Monocytes Absolute: 0.8 10*3/uL (ref 0.1–1.0)
Monocytes Relative: 7 %
Neutro Abs: 8.8 10*3/uL — ABNORMAL HIGH (ref 1.7–7.7)
Neutrophils Relative %: 80 %
Platelets: 280 10*3/uL (ref 150–400)
RBC: 3 MIL/uL — ABNORMAL LOW (ref 3.87–5.11)
RDW: 13.8 % (ref 11.5–15.5)
WBC: 11.1 10*3/uL — ABNORMAL HIGH (ref 4.0–10.5)
nRBC: 0 % (ref 0.0–0.2)

## 2021-09-22 LAB — MAGNESIUM: Magnesium: 2 mg/dL (ref 1.7–2.4)

## 2021-09-22 MED ORDER — CEPHALEXIN 500 MG PO CAPS: 500.0000 mg | ORAL_CAPSULE | Freq: Three times a day (TID) | ORAL | 0 refills | Status: AC

## 2021-09-22 MED ORDER — TRAMADOL HCL 50 MG PO TABS: 50.0000 mg | ORAL_TABLET | Freq: Four times a day (QID) | ORAL | 0 refills | Status: AC | PRN

## 2021-09-22 NOTE — Discharge Summary (Signed)
Physician Discharge Summary   Patient: Breanna Erickson MRN: [id number] DOB: Mar 19, 1996  Admit date:     09/19/2021  Discharge date: 09/22/21  Discharge Physician: Glade Lloyd   PCP: Aviva Kluver   Hospital Course:  26 year old female with a past medical history of COVID infection in December 2022 presented with fever and right-sided abdominal/flank pain.  On presentation, she was febrile, tachycardic, hypotensive with WBC of 30.6 and a UA suggestive of UTI.  CT of the abdomen showed findings consistent with right kidney pyelonephritis.  She was started on IV fluids and antibiotics.  During the hospitalization, her condition has improved.  She is currently afebrile and leukocytosis has almost resolved.  Urine culture grew E. coli.  She will be discharged home today on oral Keflex for another week.  Assessment and Plan: * Acute pyelonephritis Severe sepsis: Present on admission Leukocytosis Presented with fever, chills, tachycardia, hypotension, leukocytosis possibly from acute pyelonephritis -Treated with Rocephin. -Blood cultures cultures negative so far.  No temperature spikes over the last 24 hours. -Off IV fluids.  Hemodynamically stable and tolerating diet. -WBCs almost normal today. -Urine culture grew E. coli, pansensitive. -Sepsis has resolved. -Discharge patient home on oral Keflex for 7 more days.  Chest pain - Troponin flat.  D-dimer slightly elevated but patient denies any current chest pain. -Possibly from sepsis/pyelonephritis.  No further work-up needed.  Hypomagnesemia - Improved  Hypokalemia - Resolved.  Hyponatremia - Improved  Normocytic anemia - Hemoglobin stable.  Outpatient follow-up.         Consultants: None Procedures performed: None Disposition: Home Diet recommendation:  Regular diet DISCHARGE MEDICATION: Allergies as of 09/22/2021   No Known Allergies      Medication List     STOP taking these medications    Efinaconazole 10 %  Soln       TAKE these medications    Amnesteem 40 MG capsule Generic drug: ISOtretinoin Take 40 mg by mouth daily.   cephALEXin 500 MG capsule Commonly known as: KEFLEX Take 1 capsule (500 mg total) by mouth 3 (three) times daily for 7 days.   loratadine 10 MG tablet Commonly known as: CLARITIN Take 10 mg by mouth daily.   norethindrone-ethinyl estradiol 1-20 MG-MCG tablet Commonly known as: LOESTRIN Take 1 tablet by mouth daily.   promethazine-dextromethorphan 6.25-15 MG/5ML syrup Commonly known as: PROMETHAZINE-DM Take 5 mLs by mouth 4 (four) times daily as needed for cough.   traMADol 50 MG tablet Commonly known as: Ultram Take 1 tablet (50 mg total) by mouth every 6 (six) hours as needed for severe pain.      Subjective: Patient seen and examined at bedside.  She feels much better.  Denies any overnight fever, vomiting, worsening abdominal pain.  Wants to go home today.  Discharge Exam: Filed Weights   09/19/21 2003 09/19/21 2007 09/20/21 0230  Weight: 49 kg 49 kg 51.7 kg   General: On room air.  No distress.   respiratory: Decreased breath sounds at bases bilaterally CVS: Currently rate controlled; S1-S2 heard  abdominal: Soft, nontender, slightly distended, no organomegaly, normal bowel sounds are heard  extremities: No cyanosis, clubbing, edema   Condition at discharge: good  The results of significant diagnostics from this hospitalization (including imaging, microbiology, ancillary and laboratory) are listed below for reference.   Imaging Studies: CT ABDOMEN PELVIS W CONTRAST  Result Date: 09/19/2021 CLINICAL DATA:  Abdominal pain EXAM: CT ABDOMEN AND PELVIS WITH CONTRAST TECHNIQUE: Multidetector CT imaging of the abdomen and pelvis was  performed using the standard protocol following bolus administration of intravenous contrast. RADIATION DOSE REDUCTION: This exam was performed according to the departmental dose-optimization program which includes  automated exposure control, adjustment of the mA and/or kV according to patient size and/or use of iterative reconstruction technique. CONTRAST:  43mL OMNIPAQUE IOHEXOL 300 MG/ML  SOLN COMPARISON:  None. FINDINGS: Lower chest: No acute abnormality. Hepatobiliary: No focal liver abnormality is seen. No gallstones, gallbladder wall thickening, or biliary dilatation. Pancreas: Unremarkable. No pancreatic ductal dilatation or surrounding inflammatory changes. Spleen: Normal in size without focal abnormality. Adrenals/Urinary Tract: Adrenal glands are within normal limits. Kidneys show normal enhancement pattern with the exception of the lower pole of the right kidney which demonstrates patchy nephrogram suspicious for pyelonephritis. Small cyst is noted within the left kidney. No obstructive changes are seen. Mild enhancement of the ureteral wall on the right is noted. Bladder is decompressed. Stomach/Bowel: Fecal material is noted throughout the colon. No obstructive inflammatory changes are seen. The appendix is well visualized on the coronal images and within normal limits. Small bowel and stomach are unremarkable. Vascular/Lymphatic: No significant vascular findings are present. No enlarged abdominal or pelvic lymph nodes. Reproductive: Uterus and bilateral adnexa are unremarkable. Other: No abdominal wall hernia or abnormality. No abdominopelvic ascites. Musculoskeletal: No acute or significant osseous findings. IMPRESSION: Decreased enhancement in the lower pole of the right kidney consistent with pyelonephritis. Some associated ureteral wall enhancement is noted consistent with underlying UTI. No other focal abnormality is noted. Electronically Signed   By: Alcide Clever M.D.   On: 09/19/2021 21:52   DG Chest Portable 1 View  Result Date: 09/19/2021 CLINICAL DATA:  Cough EXAM: PORTABLE CHEST 1 VIEW COMPARISON:  None. FINDINGS: Heart size and mediastinal contours are within normal limits. No suspicious pulmonary  opacities identified. No pleural effusion or pneumothorax visualized. No acute osseous abnormality appreciated. IMPRESSION: No acute intrathoracic process identified. Electronically Signed   By: Jannifer Hick M.D.   On: 09/19/2021 20:53    Microbiology: Results for orders placed or performed during the hospital encounter of 09/19/21  Resp Panel by RT-PCR (Flu A&B, Covid) Nasopharyngeal Swab     Status: None   Collection Time: 09/19/21  8:33 PM   Specimen: Nasopharyngeal Swab; Nasopharyngeal(NP) swabs in vial transport medium  Result Value Ref Range Status   SARS Coronavirus 2 by RT PCR NEGATIVE NEGATIVE Final    Comment: (NOTE) SARS-CoV-2 target nucleic acids are NOT DETECTED.  The SARS-CoV-2 RNA is generally detectable in upper respiratory specimens during the acute phase of infection. The lowest concentration of SARS-CoV-2 viral copies this assay can detect is 138 copies/mL. A negative result does not preclude SARS-Cov-2 infection and should not be used as the sole basis for treatment or other patient management decisions. A negative result may occur with  improper specimen collection/handling, submission of specimen other than nasopharyngeal swab, presence of viral mutation(s) within the areas targeted by this assay, and inadequate number of viral copies(<138 copies/mL). A negative result must be combined with clinical observations, patient history, and epidemiological information. The expected result is Negative.  Fact Sheet for Patients:  BloggerCourse.com  Fact Sheet for Healthcare Providers:  SeriousBroker.it  This test is no t yet approved or cleared by the [place] FDA and  has been authorized for detection and/or diagnosis of SARS-CoV-2 by FDA under an Emergency Use Authorization (EUA). This EUA will remain  in effect (meaning this test can be used) for the duration of the COVID-19 declaration under Section  564(b)(1) of the Act, 21 U.S.C.section 360bbb-3(b)(1), unless the authorization is terminated  or revoked sooner.       Influenza A by PCR NEGATIVE NEGATIVE Final   Influenza B by PCR NEGATIVE NEGATIVE Final    Comment: (NOTE) The Xpert Xpress SARS-CoV-2/FLU/RSV plus assay is intended as an aid in the diagnosis of influenza from Nasopharyngeal swab specimens and should not be used as a sole basis for treatment. Nasal washings and aspirates are unacceptable for Xpert Xpress SARS-CoV-2/FLU/RSV testing.  Fact Sheet for Patients: BloggerCourse.comhttps://www.fda.gov/media/152166/download  Fact Sheet for Healthcare Providers: SeriousBroker.ithttps://www.fda.gov/media/152162/download  This test is not yet approved or cleared by the [place] FDA and has been authorized for detection and/or diagnosis of SARS-CoV-2 by FDA under an Emergency Use Authorization (EUA). This EUA will remain in effect (meaning this test can be used) for the duration of the COVID-19 declaration under Section 564(b)(1) of the Act, 21 U.S.C. section 360bbb-3(b)(1), unless the authorization is terminated or revoked.  Performed at [facility], [address] [place], [place] [address]   Urine Culture     Status: Abnormal   Collection Time: 09/19/21  8:49 PM   Specimen: In/Out Cath Urine  Result Value Ref Range Status   Specimen Description   Final    IN/OUT CATH URINE Performed at [facility], [address] [place], [place] [address]    Special Requests   Final    NONE Performed at [facility], [address] [place], [place] [address]    Culture >=100,000 COLONIES/mL ESCHERICHIA COLI (A)  Final   Report Status 09/22/2021 FINAL  Final   Organism ID, Bacteria ESCHERICHIA COLI (A)  Final      Susceptibility   Escherichia coli - MIC*    AMPICILLIN <=2 SENSITIVE Sensitive     CEFAZOLIN <=4 SENSITIVE Sensitive     CEFEPIME <=0.12 SENSITIVE Sensitive     CEFTRIAXONE  <=0.25 SENSITIVE Sensitive     CIPROFLOXACIN <=0.25 SENSITIVE Sensitive     GENTAMICIN <=1 SENSITIVE Sensitive     IMIPENEM <=0.25 SENSITIVE Sensitive     NITROFURANTOIN <=16 SENSITIVE Sensitive     TRIMETH/SULFA <=20 SENSITIVE Sensitive     AMPICILLIN/SULBACTAM <=2 SENSITIVE Sensitive     PIP/TAZO <=4 SENSITIVE Sensitive     * >=100,000 COLONIES/mL ESCHERICHIA COLI  Blood Culture (routine x 2)     Status: None (Preliminary result)   Collection Time: 09/20/21 12:00 AM   Specimen: Left Antecubital; Blood  Result Value Ref Range Status   Specimen Description   Final    LEFT ANTECUBITAL Performed at [facility], [address] [place], [place] [address]    Special Requests   Final    BOTTLES DRAWN AEROBIC AND ANAEROBIC Blood Culture adequate volume Performed at [facility], [address] [place], [place] [address]    Culture   Final    NO GROWTH 1 DAY Performed at [facility], [address] [place], [place] [address]    Report Status PENDING  Incomplete  Blood Culture (routine x 2)     Status: None (Preliminary result)   Collection Time: 09/20/21 12:00 AM   Specimen: Right Antecubital; Blood  Result Value Ref Range Status   Specimen Description   Final    RIGHT ANTECUBITAL Performed at [facility], [address] [place], [place] [address]    Special Requests   Final    BOTTLES DRAWN AEROBIC AND ANAEROBIC Blood  Culture adequate volume Performed at [facility], [address] [place], [place] [address]    Culture   Final    NO GROWTH 1 DAY Performed at [facility], [address] [place], [place] [address]    Report Status PENDING  Incomplete  MRSA Next Gen by PCR, Nasal     Status: None   Collection Time: 09/20/21  2:36 AM   Specimen: Nasal Mucosa; Nasal Swab  Result Value Ref Range Status   MRSA by PCR Next Gen NOT DETECTED NOT DETECTED Final    Comment: (NOTE) The  GeneXpert MRSA Assay (FDA approved for NASAL specimens only), is one component of a comprehensive MRSA colonization surveillance program. It is not intended to diagnose MRSA infection nor to guide or monitor treatment for MRSA infections. Test performance is not FDA approved in patients less than 82 years old. Performed at [facility], [address] [place], [place] [address]     Labs: CBC: Recent Labs  Lab 09/19/21 2051 09/20/21 0656 09/21/21 0604 09/22/21 0517  WBC 30.6* 22.3* 20.8* 11.1*  NEUTROABS 27.6*  --  17.8* 8.8*  HGB 11.1* 9.2* 8.9* 9.3*  HCT 32.2* 27.4* 27.6* 27.9*  MCV 90.7 92.3 95.2 93.0  PLT 305 243 253 280   Basic Metabolic Panel: Recent Labs  Lab 09/19/21 2051 09/20/21 0656 09/21/21 0604 09/22/21 0517  NA 133* 134* 137 135  K 3.2* 3.8 4.1 4.0  CL 102 106 110 109  CO2 22 20* 22 20*  GLUCOSE 110* 93 96 92  BUN CREATININE 0.91 0.70 0.76 0.52  CALCIUM 8.8* 7.5* 8.1* 8.0*  MG  --  1.6* 2.5* 2.0   Liver Function Tests: Recent Labs  Lab 09/19/21 2051  AST 15  ALT 9  ALKPHOS 40  BILITOT 0.6  PROT 7.7  ALBUMIN 3.8   CBG: No results for input(s): GLUCAP in the last 168 hours.  Discharge time spent: greater than 30 minutes.  Signed: Glade Lloyd, MD [facility] 09/22/2021

## 2021-09-25 LAB — CULTURE, BLOOD (ROUTINE X 2)
Culture: NO GROWTH
Culture: NO GROWTH
Special Requests: ADEQUATE
Special Requests: ADEQUATE

## 2022-11-14 ENCOUNTER — Ambulatory Visit: Payer: BC Managed Care – PPO | Admitting: Podiatry

## 2022-11-14 DIAGNOSIS — L600 Ingrowing nail: Secondary | ICD-10-CM

## 2022-11-14 MED ORDER — DOXYCYCLINE HYCLATE 100 MG PO TABS: 100.0000 mg | ORAL_TABLET | Freq: Two times a day (BID) | ORAL | 0 refills | Status: AC

## 2022-11-14 NOTE — Progress Notes (Signed)
   Chief Complaint  Patient presents with   Ingrown Toenail    Patient came in today for left hallux ingrown toenail, started week ago, patient denies any drainage, some swelling and redness    Subjective: Patient presents today for evaluation of pain to the lateral border left great toe. Patient is concerned for possible ingrown nail.  It is very sensitive to touch.  Patient presents today for further treatment and evaluation.  Past Medical History:  Diagnosis Date   COVID-19 06/2021    Objective:  General: Well developed, nourished, in no acute distress, alert and oriented x3   Dermatology: Skin is warm, dry and supple bilateral.  Lateral border left great toe is tender with evidence of an ingrowing nail. Pain on palpation noted to the border of the nail fold. The remaining nails appear unremarkable at this time. There are no open sores, lesions.  Vascular: DP and PT pulses palpable.  No clinical evidence of vascular compromise  Neruologic: Grossly intact via light touch bilateral.  Musculoskeletal: No pedal deformity noted  Assesement: #1 Paronychia with ingrowing nail lateral border left great toe  Plan of Care:  1. Patient evaluated.  2. Discussed treatment alternatives and plan of care. Explained nail avulsion procedure and post procedure course to patient. 3. Patient opted for permanent partial nail avulsion of the ingrown portion of the nail.  4. Prior to procedure, local anesthesia infiltration utilized using 3 ml of a 50:50 mixture of 2% plain lidocaine and 0.5% plain marcaine in a normal hallux block fashion and a betadine prep performed.  5. Partial permanent nail avulsion with chemical matrixectomy performed using 3x30sec applications of phenol followed by alcohol flush.  6. Light dressing applied.  Post care instructions provided 7.  Prescription for doxycycline 100 mg 2 times daily #20 8.  Return to clinic 3 weeks  Felecia Shelling, DPM [facility]  Dr. Felecia Shelling, DPM    [address] [place], [place] [address]                Office [phone]  Fax [phone]

## 2022-11-27 ENCOUNTER — Ambulatory Visit: Payer: BC Managed Care – PPO | Admitting: Podiatry

## 2022-12-06 ENCOUNTER — Ambulatory Visit: Payer: BC Managed Care – PPO | Admitting: Podiatry

## 2022-12-06 DIAGNOSIS — L600 Ingrowing nail: Secondary | ICD-10-CM

## 2022-12-06 NOTE — Progress Notes (Signed)
   Chief Complaint  Patient presents with   Ingrown Toenail    Patient came in today for left hallux ingrown removal follow-up, patient denies any pain    Subjective: 27 y.o. female presents today status post permanent nail avulsion procedure of the lateral border left great toe that was performed on 11/14/2022.   Past Medical History:  Diagnosis Date   COVID-19 06/2021    Objective: Neurovascular status intact.  Skin is warm, dry and supple. Nail and respective nail fold appears to be healing appropriately.   Assessment: #1 s/p partial permanent nail matrixectomy lateral border left great toe   Plan of care: #1 patient was evaluated  #2 light debridement of the periungual debris was performed to the border of the respective toe and nail plate using a tissue nipper. #3 patient is to return to clinic on a PRN basis.   Felecia Shelling, DPM [facility]  Dr. Felecia Shelling, DPM    [address] [place], [place] [address]                Office [phone]  Fax [phone]

## 2023-01-17 IMAGING — CT CT ABD-PELV W/ CM
2 of 4 series · 15 of 46 positions shown, 17 images · IV contrast (agent unspecified)
Comparison: None.

CLINICAL DATA: Abdominal pain

EXAM:
CT ABDOMEN AND PELVIS WITH CONTRAST
TECHNIQUE: Multidetector CT imaging of the abdomen and pelvis was performed
using the standard protocol following bolus administration of
intravenous contrast.

[Series 2: axial st · axial · 0.68mm/px · z∈[-440,-45]mm · 12 of 89 slices shown, 14 images]
[im 5/89  soft-tissue]
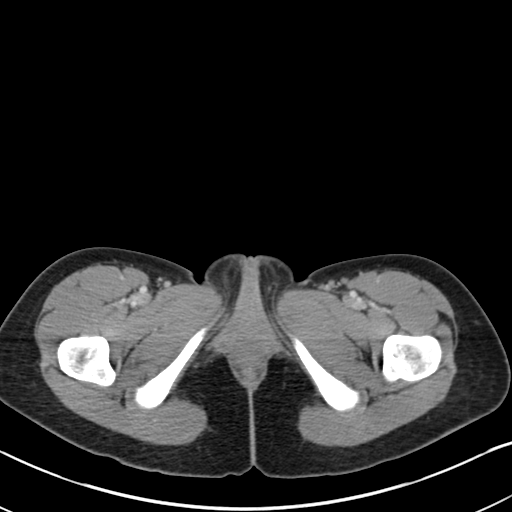
[im 5/89  bone]
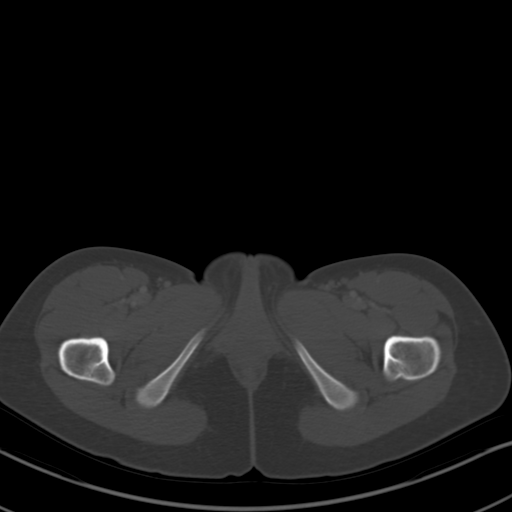
[im 14/89  soft-tissue]
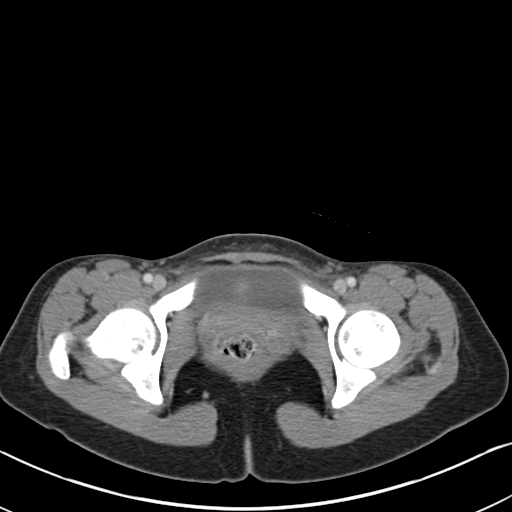
[im 18/89  soft-tissue]
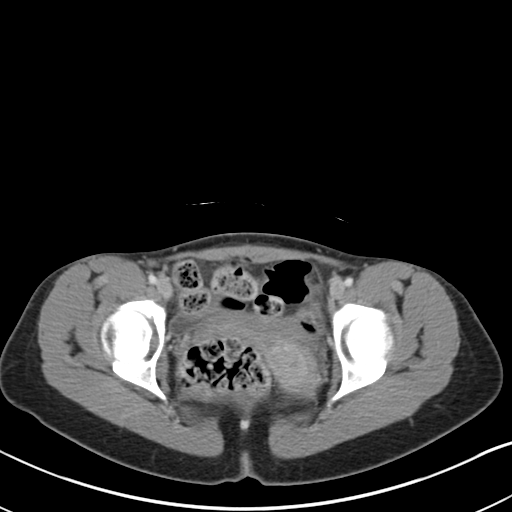
[im 27/89  soft-tissue]
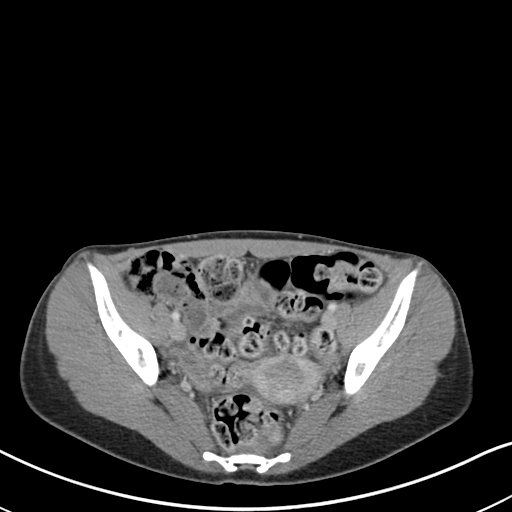
[im 36/89  soft-tissue]
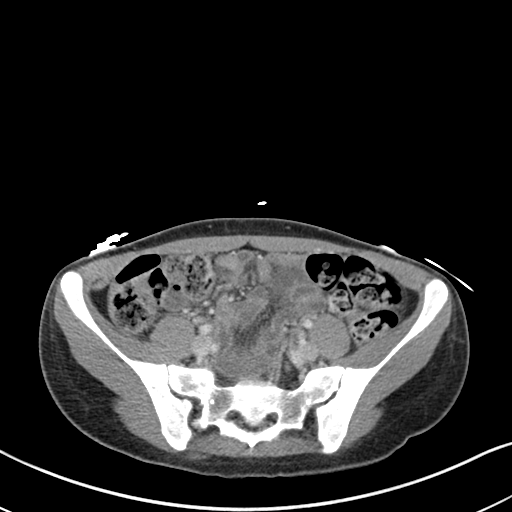
[im 40/89  soft-tissue]
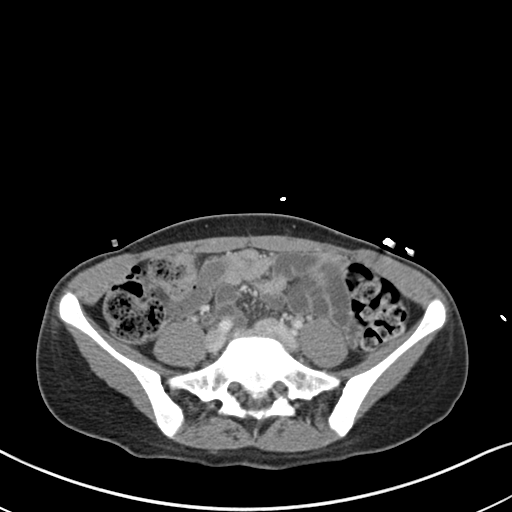
[im 49/89  soft-tissue]
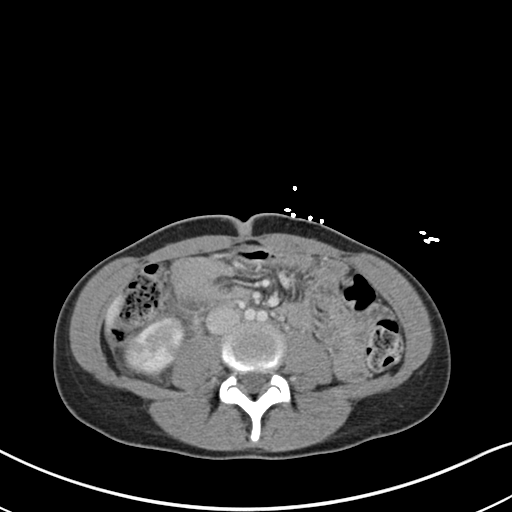
[im 53/89  soft-tissue]
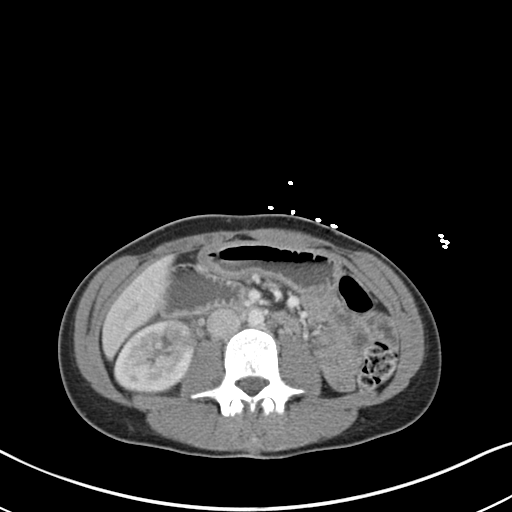
[im 62/89  soft-tissue]
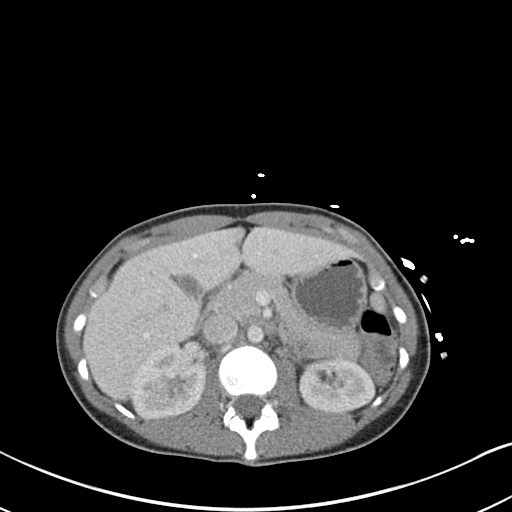
[im 62/89  bone]
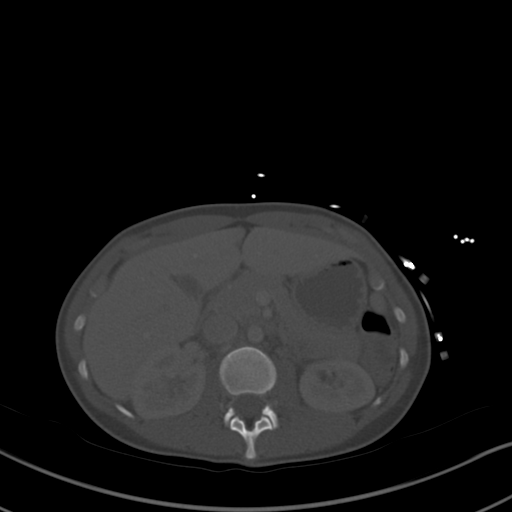
[im 71/89  soft-tissue]
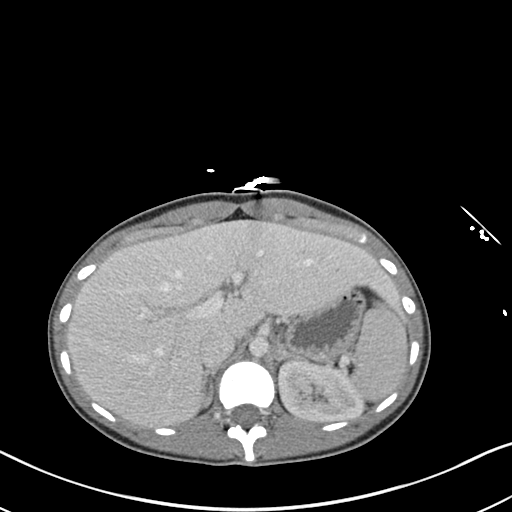
[im 75/89  soft-tissue]
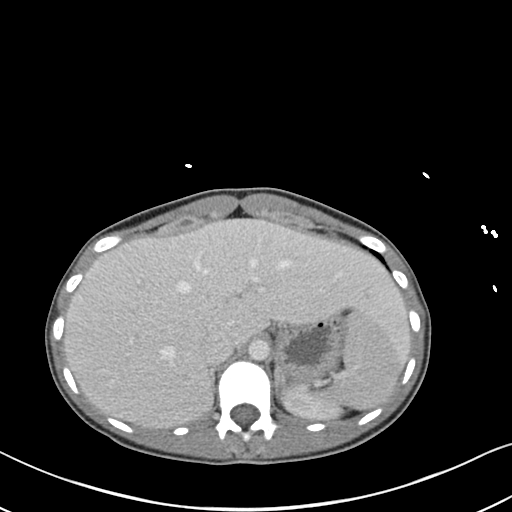
[im 84/89  soft-tissue]
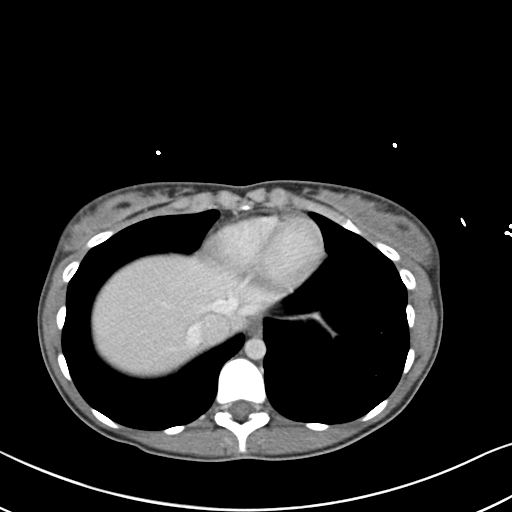

[Series 4: coronal st · coronal · 0.52mm/px · 3 of 103 slices shown]
[im 35/103  soft-tissue]
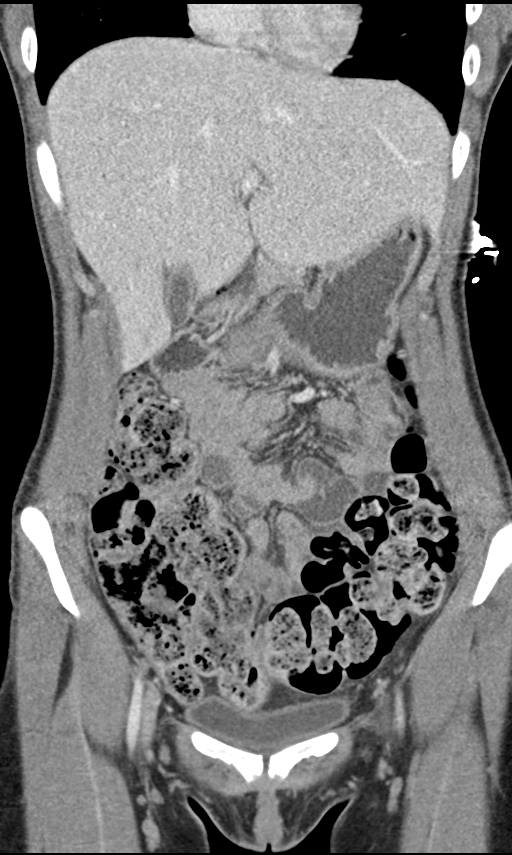
[im 46/103  soft-tissue]
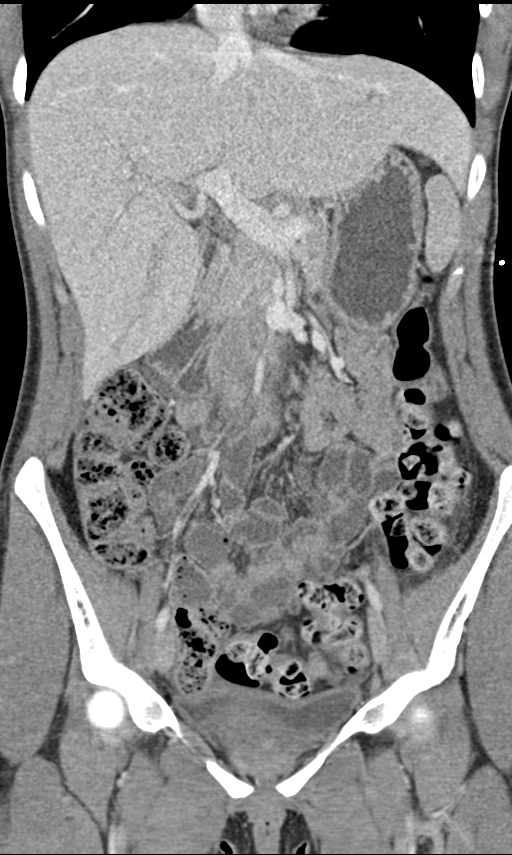
[im 57/103  soft-tissue]
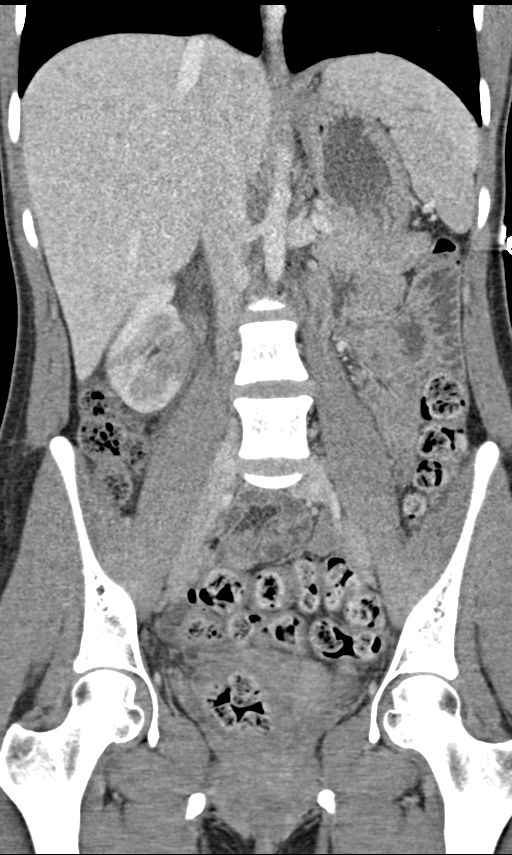

[15 of 46 positions shown; findings below may reference images not displayed]

RADIATION DOSE REDUCTION: This exam was performed according to the
departmental dose-optimization program which includes automated
exposure control, adjustment of the mA and/or kV according to
patient size and/or use of iterative reconstruction technique.

CONTRAST:  80mL OMNIPAQUE IOHEXOL 300 MG/ML  SOLN
FINDINGS: Lower chest: No acute abnormality.

Hepatobiliary: No focal liver abnormality is seen. No gallstones,
gallbladder wall thickening, or biliary dilatation.

Pancreas: Unremarkable. No pancreatic ductal dilatation or
surrounding inflammatory changes.

Spleen: Normal in size without focal abnormality.

Adrenals/Urinary Tract: Adrenal glands are within normal limits.
Kidneys show normal enhancement pattern with the exception of the
lower pole of the right kidney which demonstrates patchy nephrogram
suspicious for pyelonephritis. Small cyst is noted within the left
kidney. No obstructive changes are seen. Mild enhancement of the
ureteral wall on the right is noted. Bladder is decompressed.

Stomach/Bowel: Fecal material is noted throughout the colon. No
obstructive inflammatory changes are seen. The appendix is well
visualized on the coronal images and within normal limits. Small
bowel and stomach are unremarkable.

Vascular/Lymphatic: No significant vascular findings are present. No
enlarged abdominal or pelvic lymph nodes.

Reproductive: Uterus and bilateral adnexa are unremarkable.

Other: No abdominal wall hernia or abnormality. No abdominopelvic
ascites.

Musculoskeletal: No acute or significant osseous findings.
IMPRESSION: Decreased enhancement in the lower pole of the right kidney
consistent with pyelonephritis. Some associated ureteral wall
enhancement is noted consistent with underlying UTI.

No other focal abnormality is noted.
